# Patient Record
Sex: Female | Born: 1970 | Race: White | Hispanic: No | Smoking: Former smoker
Health system: Southern US, Community
[De-identification: ages and names within clinical notes are randomized; demographics above are authoritative.]

## PROBLEM LIST (undated history)

## (undated) DIAGNOSIS — O3090 Multiple gestation, unspecified, unspecified trimester: Secondary | ICD-10-CM

## (undated) DIAGNOSIS — G4730 Sleep apnea, unspecified: Secondary | ICD-10-CM

## (undated) DIAGNOSIS — G4733 Obstructive sleep apnea (adult) (pediatric): Secondary | ICD-10-CM

## (undated) DIAGNOSIS — K760 Fatty (change of) liver, not elsewhere classified: Secondary | ICD-10-CM

## (undated) DIAGNOSIS — K921 Melena: Secondary | ICD-10-CM

## (undated) DIAGNOSIS — E611 Iron deficiency: Secondary | ICD-10-CM

## (undated) DIAGNOSIS — E785 Hyperlipidemia, unspecified: Secondary | ICD-10-CM

## (undated) DIAGNOSIS — N200 Calculus of kidney: Secondary | ICD-10-CM

## (undated) DIAGNOSIS — T7840XA Allergy, unspecified, initial encounter: Secondary | ICD-10-CM

## (undated) DIAGNOSIS — E559 Vitamin D deficiency, unspecified: Secondary | ICD-10-CM

## (undated) DIAGNOSIS — N12 Tubulo-interstitial nephritis, not specified as acute or chronic: Secondary | ICD-10-CM

## (undated) DIAGNOSIS — F32A Depression, unspecified: Secondary | ICD-10-CM

## (undated) DIAGNOSIS — G479 Sleep disorder, unspecified: Secondary | ICD-10-CM

## (undated) DIAGNOSIS — F329 Major depressive disorder, single episode, unspecified: Secondary | ICD-10-CM

## (undated) DIAGNOSIS — E063 Autoimmune thyroiditis: Secondary | ICD-10-CM

## (undated) DIAGNOSIS — F419 Anxiety disorder, unspecified: Secondary | ICD-10-CM

## (undated) DIAGNOSIS — E039 Hypothyroidism, unspecified: Secondary | ICD-10-CM

## (undated) DIAGNOSIS — D649 Anemia, unspecified: Secondary | ICD-10-CM

## (undated) HISTORY — DX: Fatty (change of) liver, not elsewhere classified: K76.0

## (undated) HISTORY — DX: Calculus of kidney: N20.0

## (undated) HISTORY — DX: Multiple gestation, unspecified, unspecified trimester: O30.90

## (undated) HISTORY — DX: Hyperlipidemia, unspecified: E78.5

## (undated) HISTORY — DX: Obstructive sleep apnea (adult) (pediatric): G47.33

## (undated) HISTORY — DX: Hypothyroidism, unspecified: E03.9

## (undated) HISTORY — DX: Anemia, unspecified: D64.9

## (undated) HISTORY — DX: Anxiety disorder, unspecified: F41.9

## (undated) HISTORY — DX: Vitamin D deficiency, unspecified: E55.9

## (undated) HISTORY — DX: Depression, unspecified: F32.A

## (undated) HISTORY — DX: Tubulo-interstitial nephritis, not specified as acute or chronic: N12

## (undated) HISTORY — DX: Autoimmune thyroiditis: E06.3

## (undated) HISTORY — DX: Iron deficiency: E61.1

## (undated) HISTORY — DX: Major depressive disorder, single episode, unspecified: F32.9

## (undated) HISTORY — DX: Sleep apnea, unspecified: G47.30

## (undated) HISTORY — DX: Sleep disorder, unspecified: G47.9

## (undated) HISTORY — DX: Allergy, unspecified, initial encounter: T78.40XA

## (undated) HISTORY — PX: WISDOM TOOTH EXTRACTION: SHX21

## (undated) HISTORY — DX: Melena: K92.1

---

## 2001-12-08 ENCOUNTER — Encounter: Payer: Self-pay | Admitting: Obstetrics and Gynecology

## 2001-12-08 ENCOUNTER — Encounter: Admission: RE | Admit: 2001-12-08 | Discharge: 2001-12-08 | Payer: Self-pay | Admitting: Obstetrics and Gynecology

## 2002-09-19 ENCOUNTER — Inpatient Hospital Stay (HOSPITAL_COMMUNITY): Admission: AD | Admit: 2002-09-19 | Discharge: 2002-09-21 | Payer: Self-pay | Admitting: Obstetrics and Gynecology

## 2004-09-18 ENCOUNTER — Observation Stay (HOSPITAL_COMMUNITY): Admission: AD | Admit: 2004-09-18 | Discharge: 2004-09-18 | Payer: Self-pay | Admitting: Obstetrics & Gynecology

## 2005-01-26 ENCOUNTER — Inpatient Hospital Stay (HOSPITAL_COMMUNITY): Admission: AD | Admit: 2005-01-26 | Discharge: 2005-01-26 | Payer: Self-pay | Admitting: Obstetrics and Gynecology

## 2005-03-19 ENCOUNTER — Inpatient Hospital Stay (HOSPITAL_COMMUNITY): Admission: AD | Admit: 2005-03-19 | Discharge: 2005-03-21 | Payer: Self-pay | Admitting: Obstetrics and Gynecology

## 2005-08-05 ENCOUNTER — Ambulatory Visit: Payer: Self-pay | Admitting: Internal Medicine

## 2006-08-25 ENCOUNTER — Ambulatory Visit: Payer: Self-pay | Admitting: Internal Medicine

## 2006-09-02 ENCOUNTER — Ambulatory Visit (HOSPITAL_COMMUNITY): Admission: RE | Admit: 2006-09-02 | Discharge: 2006-09-02 | Payer: Self-pay | Admitting: Urology

## 2006-09-29 ENCOUNTER — Ambulatory Visit: Payer: Self-pay | Admitting: Internal Medicine

## 2007-06-23 ENCOUNTER — Ambulatory Visit: Payer: Self-pay | Admitting: Internal Medicine

## 2007-06-23 DIAGNOSIS — Z87448 Personal history of other diseases of urinary system: Secondary | ICD-10-CM | POA: Insufficient documentation

## 2007-06-23 LAB — CONVERTED CEMR LAB
Bilirubin Urine: NEGATIVE
Blood in Urine, dipstick: NEGATIVE
Glucose, Urine, Semiquant: NEGATIVE
Ketones, urine, test strip: NEGATIVE
Nitrite: NEGATIVE
Protein, U semiquant: NEGATIVE
Specific Gravity, Urine: <1.005
Urobilinogen, UA: NEGATIVE
WBC Urine, dipstick: NEGATIVE
pH: 7

## 2007-07-05 ENCOUNTER — Encounter: Payer: Self-pay | Admitting: Internal Medicine

## 2007-09-22 ENCOUNTER — Encounter: Admission: RE | Admit: 2007-09-22 | Discharge: 2007-09-22 | Payer: Self-pay | Admitting: Obstetrics and Gynecology

## 2007-12-23 ENCOUNTER — Encounter: Payer: Self-pay | Admitting: Internal Medicine

## 2008-01-17 ENCOUNTER — Encounter: Payer: Self-pay | Admitting: Pulmonary Disease

## 2008-01-18 ENCOUNTER — Encounter: Payer: Self-pay | Admitting: Pulmonary Disease

## 2008-01-31 ENCOUNTER — Ambulatory Visit: Payer: Self-pay | Admitting: Internal Medicine

## 2008-11-22 ENCOUNTER — Ambulatory Visit: Payer: Self-pay | Admitting: Internal Medicine

## 2008-11-22 DIAGNOSIS — E785 Hyperlipidemia, unspecified: Secondary | ICD-10-CM | POA: Insufficient documentation

## 2008-11-22 LAB — CONVERTED CEMR LAB
Cholesterol, target level: 200 mg/dL
HDL goal, serum: 40 mg/dL
LDL Goal: 160 mg/dL

## 2008-11-23 LAB — CONVERTED CEMR LAB: Glucose, Bld: 82 mg/dL (ref 70–99)

## 2008-11-26 ENCOUNTER — Encounter (INDEPENDENT_AMBULATORY_CARE_PROVIDER_SITE_OTHER): Payer: Self-pay | Admitting: *Deleted

## 2008-11-27 ENCOUNTER — Encounter: Payer: Self-pay | Admitting: Internal Medicine

## 2008-12-12 ENCOUNTER — Telehealth: Payer: Self-pay | Admitting: Internal Medicine

## 2008-12-17 HISTORY — PX: SEPTOPLASTY: SHX2393

## 2009-02-05 ENCOUNTER — Ambulatory Visit: Payer: Self-pay | Admitting: Internal Medicine

## 2009-04-09 ENCOUNTER — Ambulatory Visit: Payer: Self-pay | Admitting: Internal Medicine

## 2009-05-15 ENCOUNTER — Ambulatory Visit: Payer: Self-pay | Admitting: Internal Medicine

## 2009-05-15 DIAGNOSIS — G479 Sleep disorder, unspecified: Secondary | ICD-10-CM | POA: Insufficient documentation

## 2009-05-15 LAB — CONVERTED CEMR LAB
HDL goal, serum: 50 mg/dL
LDL Goal: 130 mg/dL

## 2009-05-29 ENCOUNTER — Telehealth (INDEPENDENT_AMBULATORY_CARE_PROVIDER_SITE_OTHER): Payer: Self-pay | Admitting: *Deleted

## 2009-05-30 ENCOUNTER — Ambulatory Visit: Payer: Self-pay | Admitting: Internal Medicine

## 2009-06-04 ENCOUNTER — Telehealth (INDEPENDENT_AMBULATORY_CARE_PROVIDER_SITE_OTHER): Payer: Self-pay | Admitting: *Deleted

## 2009-06-04 LAB — CONVERTED CEMR LAB
ALT: 35 units/L (ref 0–35)
Albumin: 3.6 g/dL (ref 3.5–5.2)
Alkaline Phosphatase: 93 units/L (ref 39–117)
Basophils Relative: 0.6 % (ref 0.0–3.0)
Bilirubin, Direct: 0.1 mg/dL (ref 0.0–0.3)
CO2: 25 meq/L (ref 19–32)
Chloride: 105 meq/L (ref 96–112)
Creatinine, Ser: 0.9 mg/dL (ref 0.4–1.2)
Eosinophils Relative: 1.3 % (ref 0.0–5.0)
Glucose, Bld: 88 mg/dL (ref 70–99)
HCT: 40.1 % (ref 36.0–46.0)
Hemoglobin: 13.8 g/dL (ref 12.0–15.0)
Lymphs Abs: 2 10*3/uL (ref 0.7–4.0)
MCV: 85.1 fL (ref 78.0–100.0)
Monocytes Relative: 6.1 % (ref 3.0–12.0)
Neutro Abs: 5.5 10*3/uL (ref 1.4–7.7)
Sodium: 138 meq/L (ref 135–145)
TSH: 3.26 microintl units/mL (ref 0.35–5.50)
Total Protein: 7.3 g/dL (ref 6.0–8.3)
WBC: 8.1 10*3/uL (ref 4.5–10.5)

## 2009-06-05 ENCOUNTER — Encounter (INDEPENDENT_AMBULATORY_CARE_PROVIDER_SITE_OTHER): Payer: Self-pay | Admitting: *Deleted

## 2009-06-26 ENCOUNTER — Encounter: Payer: Self-pay | Admitting: Internal Medicine

## 2009-06-26 ENCOUNTER — Telehealth (INDEPENDENT_AMBULATORY_CARE_PROVIDER_SITE_OTHER): Payer: Self-pay | Admitting: *Deleted

## 2009-07-03 ENCOUNTER — Telehealth (INDEPENDENT_AMBULATORY_CARE_PROVIDER_SITE_OTHER): Payer: Self-pay | Admitting: *Deleted

## 2009-07-04 ENCOUNTER — Ambulatory Visit: Payer: Self-pay | Admitting: Internal Medicine

## 2009-07-04 DIAGNOSIS — E039 Hypothyroidism, unspecified: Secondary | ICD-10-CM | POA: Insufficient documentation

## 2009-07-04 DIAGNOSIS — R0609 Other forms of dyspnea: Secondary | ICD-10-CM

## 2009-07-04 DIAGNOSIS — R030 Elevated blood-pressure reading, without diagnosis of hypertension: Secondary | ICD-10-CM | POA: Insufficient documentation

## 2009-07-04 DIAGNOSIS — R0989 Other specified symptoms and signs involving the circulatory and respiratory systems: Secondary | ICD-10-CM | POA: Insufficient documentation

## 2009-07-08 ENCOUNTER — Encounter (INDEPENDENT_AMBULATORY_CARE_PROVIDER_SITE_OTHER): Payer: Self-pay | Admitting: *Deleted

## 2009-09-17 ENCOUNTER — Encounter: Payer: Self-pay | Admitting: Pulmonary Disease

## 2009-09-18 ENCOUNTER — Encounter: Payer: Self-pay | Admitting: Pulmonary Disease

## 2010-01-21 ENCOUNTER — Encounter: Payer: Self-pay | Admitting: Internal Medicine

## 2010-02-07 ENCOUNTER — Encounter: Payer: Self-pay | Admitting: Pulmonary Disease

## 2010-07-02 ENCOUNTER — Ambulatory Visit: Payer: Self-pay | Admitting: Pulmonary Disease

## 2010-07-02 DIAGNOSIS — G4710 Hypersomnia, unspecified: Secondary | ICD-10-CM | POA: Insufficient documentation

## 2010-08-08 ENCOUNTER — Telehealth (INDEPENDENT_AMBULATORY_CARE_PROVIDER_SITE_OTHER): Payer: Self-pay | Admitting: *Deleted

## 2010-08-14 ENCOUNTER — Ambulatory Visit: Payer: Self-pay | Admitting: Pulmonary Disease

## 2010-08-27 ENCOUNTER — Encounter (INDEPENDENT_AMBULATORY_CARE_PROVIDER_SITE_OTHER): Payer: Self-pay | Admitting: *Deleted

## 2010-10-10 ENCOUNTER — Ambulatory Visit: Payer: Self-pay | Admitting: Pulmonary Disease

## 2010-12-14 LAB — CONVERTED CEMR LAB
Basophils Absolute: 0 10*3/uL (ref 0.0–0.1)
Eosinophils Absolute: 0.1 10*3/uL (ref 0.0–0.7)
HCT: 42.7 % (ref 36.0–46.0)
Hemoglobin: 14.4 g/dL (ref 12.0–15.0)
Lymphs Abs: 2.3 10*3/uL (ref 0.7–4.0)
MCHC: 33.6 g/dL (ref 30.0–36.0)
MCV: 87.1 fL (ref 78.0–100.0)
Monocytes Absolute: 0.2 10*3/uL (ref 0.1–1.0)
Monocytes Relative: 1.9 % — ABNORMAL LOW (ref 3.0–12.0)
Neutro Abs: 5.6 10*3/uL (ref 1.4–7.7)
RDW: 14.8 % — ABNORMAL HIGH (ref 11.5–14.6)

## 2010-12-16 NOTE — Assessment & Plan Note (Signed)
Summary: rov for hypersomnia   Primary Provider/Referring Provider:  Pearson Grippe  CC:  Pt states she has been having some sleep issues. Pt states she is not getting a restful sleep. Pt states requip did not help her at all. Pt states she is feeling tired and sleepy throught out the day. Marland Kitchen  History of Present Illness: The pt comes in today for f/u of her known hypersomnia.  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  She was tried on a dopamine agonist last visit, but has not seen any improvement since being on the medication.  She continues to have loud snoring, sleep disruption, and daytime sleepiness that is interfering with her functional status.  Allergies: No Known Drug Allergies  Past History:  Past medical, surgical, family and social histories (including risk factors) reviewed, and no changes noted (except as noted below).  Past Medical History: Reviewed history from 07/02/2010 and no changes required. G2 P2; Pyelonephritis,PMH of;  Hyperlipidemia, LDL goal = < 130 Hypothyroidism 05/2009-controlled  Past Surgical History: Reviewed history from 07/02/2010 and no changes required. Septoplasty 12/2008; Oral Surgery (Wisdom Teeth Extraction);  G2 P2  Family History: Reviewed history from 07/02/2010 and no changes required. PT ADOPTED;   Social History: Reviewed history from 07/02/2010 and no changes required. Married and lives with husband and children. pt has 2 children Alcohol use-no Former Smoker: started at age 56.  1 ppd. quit at age 63.  Regular exercise-yes:3X/week  Review of Systems       The patient complains of nasal congestion/difficulty breathing through nose.  The patient denies shortness of breath with activity, shortness of breath at rest, productive cough, non-productive cough, coughing up blood, chest pain, irregular heartbeats, acid heartburn, indigestion, loss of appetite,  weight change, abdominal pain, difficulty swallowing, sore throat, tooth/dental problems, headaches, sneezing, itching, ear ache, anxiety, depression, hand/feet swelling, joint stiffness or pain, rash, change in color of mucus, and fever.    Vital Signs:  Patient profile:   40 year old female Height:      66 inches Weight:      185.50 pounds O2 Sat:      95 % on Room air Temp:     98 degrees F oral Pulse rate:   98 / minute BP sitting:   114 / 82  (left arm) Cuff size:   regular  Vitals Entered By: Carver Fila (August 14, 2010 9:06 AM)  O2 Flow:  Room air CC: Pt states she has been having some sleep issues. Pt states she is not getting a restful sleep. Pt states requip did not help her at all. Pt states she is feeling tired and sleepy throught out the day.  Comments meds and allergies updated Phone number updated Carver Fila  August 14, 2010 9:08 AM    Physical Exam  General:  ow female in nad Extremities:  no edema or cyanosis  Neurologic:  alert, but does appear sleepy. moves all 4.   Impression & Recommendations:  Problem # 1:  HYPERSOMNIA (ICD-780.54) the pt has very loud snoring during the night, along with an abnormal breathing pattern per her husband.  She has significant daytime sleepiness, and has gained a large amount of weight the last 2 years.  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  I am concerned about her safety while driving, and also her work performance because of this issue.  I would like to try her on cpap to see if she will see improvement.  I have also stressed the need to work on weight loss.  Other Orders: Est. Patient Level III (40347) DME Referral (DME)  Patient Instructions: 1)  will see if we can get you cpap for a trial for treatment of your upper airway resistance 2)  work on weight loss 3)  continue to monitor for  "environmental factors" that can disrupt your sleep. 4)  f/u with me in 4 weeks.   Immunization History:  Influenza Immunization History:    Influenza:  historical (08/25/2009)

## 2010-12-16 NOTE — Letter (Signed)
Summary: CMN for CPAP Supplies/[facility]  CMN for CPAP Supplies/[facility]   Imported By: Sherian Rein 10/15/2010 12:03:41  _____________________________________________________________________  External Attachment:    Type:   Image     Comment:   External Document

## 2010-12-16 NOTE — Letter (Signed)
SummaryScience writer Pulmonary Care Appointment Letter  [facility]  [address]   [place], [place] [address]   Phone: [phone]  Fax: [phone]    08/27/2010 MRN: [id number]  Ashleymarie Voris [address] [place], [place]  [address]  Dear Ms. Stansbury,   Our office is attempting to contact you about an appointment.  Please call our office at [phone] to re-schedule this appointment with Dr._Clance. He is out of office 09/11/10.  Our registration staff is prepared to assist you with any questions you may have.    Thank you,   Nature conservation officer Pulmonary Division

## 2010-12-16 NOTE — Progress Notes (Signed)
Summary: requip not working  Phone Note Call from Patient Call back at Work Phone [phone]   Caller: Patient Call For: clance Summary of Call: pt is calling back on requip this is not working for her needs something else Initial call taken by: Lacinda Axon,  August 08, 2010 12:49 PM  Follow-up for Phone Call        called spoke with patient who states that she still wakes up exhausted every morning even with the requip.  states that at ov, if this didn't work then will consider pulmonary therapies like CPAP.  please advise, thanks!  note: pt cancelled her 9.9.11 ov, has no upcoming scheduled. Follow-up by: Boone Master CNA/MA,  August 08, 2010 3:07 PM  Additional Follow-up for Phone Call Additional follow up Details #1::        I would like to see her back in office to discuss where to go from here. Additional Follow-up by: Barbaraann Share MD,  August 11, 2010 5:08 PM    Additional Follow-up for Phone Call Additional follow up Details #2::    Spoke with pt and scheduled appt with [facility] for 08/14/10 at 9 am. Follow-up by: Vernie Murders,  August 11, 2010 5:16 PM

## 2010-12-16 NOTE — Assessment & Plan Note (Signed)
Summary: self referral for hypersomnia   Copy to:  Self Referral Primary Provider/Referring Provider:  Pearson Grippe  CC:  Sleep Consult.  History of Present Illness: The pt is a 40y/o female who comes in as a self referral for evaluation of daytime sleepiness.  This has been an issue for her since college, and has been persistent in nature.  The pt has had multiple sleep studies for diagnosis, with conflicting data noted.  Her sleep study in 2009 showed an AHI of 0.6/hr, but had large numbers of PLMS with 12/hr resulting in arousal or awakening.  This was followed by an MSLT with a mean sleep latency of 8.36min, and no SOREMs noted.  She had a sleep study in Nov 2010 which showed an AHI of 0.9/hr, but also 110 leg jerks with 5/hr. causing arousal or awakenings.  This was followed by a MSLT with a 1.68min mean sleep onset latency, and 2 SOREMs.  Her sleep diaries and medication lists are not available to me.  She had another sleep study in Mar of this year with an AHI of 3/hr.  The report I have available mentions nothing about leg jerks, but the raw data is not available currently.  The pt was asked to avoid supine position while sleeping, and given nuvigil to use as needed.  She never tried the nuvigil.  The pt has been noted to have loud snoring according to her husband, and has an abnormal breathing pattern during sleep.  She goes to bed btw 10-11pm, and arises at 7-8am to start her day.  She has frequent awakenings at times, and is never rested upon arising.  She sleeps in a different room from her husband, but does note her covers are all "messed up" in the mornings.  She has had RLS symptoms in the early evening, but not on a regular basis.  She notes definite sleep pressure and fogginess during the day, and is not happy with her alertness or memory level.  In the evening, she can read or watch tv without falling asleep.  She can have sleep pressure at times with driving.  She denies any hypnogogic  hallucinations, sleep paralysis, or cateplexy.  She has had no major MVA's or head trauma in the past.  She does state that her weight is up 45 pounds over the last 2 years, and her epworth score today is only 11.  Current Medications (verified): 1)  Lexapro 10 Mg Tabs (Escitalopram Oxalate) .... Take 1 Tablet By Mouth Once A Day 2)  Synthroid 50 Mcg Tabs (Levothyroxine Sodium) .Marland Kitchen.. 1 By Mouth Once Daily 3)  Wellbutrin Xl 300 Mg Xr24h-Tab (Bupropion Hcl) .... Take 1 Tablet By Mouth Once A Day 4)  Birth Control .... Take As Directed  Allergies (verified): No Known Drug Allergies  Past History:  Past Medical History: G2 P2; Pyelonephritis,PMH of;  Hyperlipidemia, LDL goal = < 130 Hypothyroidism 05/2009-controlled  Past Surgical History: Septoplasty 12/2008; Oral Surgery (Wisdom Teeth Extraction);  G2 P2  Family History: Reviewed history from 07/04/2009 and no changes required. PT ADOPTED;   Social History: Reviewed history from 07/04/2009 and no changes required. Married and lives with husband and children. pt has 2 children Alcohol use-no Former Smoker: started at age 67.  1 ppd. quit at age 45.  Regular exercise-yes:3X/week  Review of Systems       The patient complains of weight change and nasal congestion/difficulty breathing through nose.  The patient denies shortness of breath with activity, shortness  of breath at rest, productive cough, non-productive cough, coughing up blood, chest pain, irregular heartbeats, acid heartburn, indigestion, loss of appetite, abdominal pain, difficulty swallowing, sore throat, tooth/dental problems, headaches, sneezing, itching, ear ache, anxiety, depression, hand/feet swelling, joint stiffness or pain, rash, change in color of mucus, and fever.    Vital Signs:  Patient profile:   40 year old female Height:      66 inches Weight:      181.13 pounds BMI:     29.34 O2 Sat:      96 % on Room air Temp:     97.9 degrees F oral Pulse rate:    92 / minute BP sitting:   102 / 64  (left arm) Cuff size:   large  Vitals Entered By: Arman Filter LPN (July 02, 2010 11:06 AM)  O2 Flow:  Room air CC: Sleep Consult Comments Medications reviewed with patient Arman Filter LPN  July 02, 2010 11:10 AM    Physical Exam  General:  ow female in nad Eyes:  PERRLA and EOMI.   Nose:  very narrowed bilat, but not obstructed Mouth:  small oropharynx with crowding, elongation of soft palate and uvula. Neck:  no jvd, tmg, LN Lungs:  clear to auscultation Heart:  rrr, no mrg Abdomen:  soft and nontender, bs+ Extremities:  no edema or cyanosis  pulses intact distally Neurologic:  alert and oriented, moves all 4.   Impression & Recommendations:  Problem # 1:  HYPERSOMNIA (ICD-780.54) the pt has disrupted sleep, feels unrested in the am's, and has daytime sleepiness that is affecting her QOL.  It is hard to make complete sense of her various sleep studies, but it is obvious she does not have clinically significant osa.  She may have UARS in light of her heavy snoring and abnormal upper airway anatomy, which can certainly disrupt her sleep.  She also has large numbers of leg jerks on her sleep studies, with significant sleep disruption documented.  She does not have overly impressive RLS symptoms, but I think she should have a trial of treatment with a dopamine agonist.  Finally, I would find it very unlikely she has narcolepsy.  The history does not fit, her epworth score is only 11, and she is able to read or watch tv at night without getting sleepy.  I would like the patient to try requip each night for at least the next 3 weeks to see if things improve.  I have also encouraged her to work on weight loss and positional therapy.  She will give me feedback in the next 3 weeks.    Medications Added to Medication List This Visit: 1)  Lexapro 10 Mg Tabs (Escitalopram oxalate) .... Take 1 tablet by mouth once a day 2)  Wellbutrin Xl 300 Mg  Xr24h-tab (Bupropion hcl) .... Take 1 tablet by mouth once a day 3)  Birth Control  .... Take as directed 4)  Requip 0.5 Mg Tabs (Ropinirole hcl) .... At bedtime  Other Orders: New Patient Level V (16109)  Patient Instructions: 1)  will try requip 0.5mg  one each night after dinner for a 3 week trial.  Can increase dose to 1mg  if helps, but still having symptoms. 2)  work on weight loss 3)  please call with response in 3 weeks.   Prescriptions: REQUIP 0.5 MG  TABS (ROPINIROLE HCL) At bedtime  #40 x 0   Entered and Authorized by:   Barbaraann Share MD   Signed by:  Barbaraann Share MD on 07/02/2010   Method used:   Print then Give to Patient   RxID:   [id number]

## 2010-12-16 NOTE — Assessment & Plan Note (Addendum)
Summary: rov for UARS   Visit Type:  Follow-up Copy to:  Self Referral Primary Provider/Referring Provider:  Pearson Grippe  CC:  Hypersomnia...pt says she is doing better on cpap w/ full face mask.Marland KitchenMarland Kitchenpressure okay...occ fatigue during the day.  History of Present Illness: the pt comes in today for f/u of her sleep disordered breathing.  She has been on cpap, and has no complaints about the mask or pressure.  She is sleeping much better, with no breakthru snoring.  She is much more rested and alert during the day, and has seen a big difference overall.  Current Medications (verified): 1)  Lexapro 10 Mg Tabs (Escitalopram Oxalate) .... Take 1 Tablet By Mouth Once A Day 2)  Synthroid 50 Mcg Tabs (Levothyroxine Sodium) .Marland Kitchen.. 1 By Mouth Once Daily 3)  Wellbutrin Xl 300 Mg Xr24h-Tab (Bupropion Hcl) .... Take 1 Tablet By Mouth Once A Day 4)  Birth Control .... Take As Directed 5)  Zyrtec Allergy 10 Mg Tbdp (Cetirizine Hcl) .Marland Kitchen.. 1 By Mouth Daily  Allergies (verified): No Known Drug Allergies  Review of Systems       The patient complains of nasal congestion/difficulty breathing through nose.  The patient denies shortness of breath with activity, shortness of breath at rest, productive cough, non-productive cough, coughing up blood, chest pain, irregular heartbeats, acid heartburn, indigestion, loss of appetite, weight change, abdominal pain, difficulty swallowing, sore throat, tooth/dental problems, headaches, sneezing, itching, ear ache, anxiety, depression, hand/feet swelling, joint stiffness or pain, rash, change in color of mucus, and fever.    Vital Signs:  Patient profile:   40 year old female Height:      66 inches (167.64 cm) Weight:      187 pounds (85 kg) BMI:     30.29 O2 Sat:      96 % on Room air Temp:     98.1 degrees F (36.72 degrees C) oral Pulse rate:   100 / minute BP sitting:   118 / 80  (right arm) Cuff size:   regular  Vitals Entered By: Michel Bickers CMA (October 10, 2010  10:14 AM)  O2 Sat at Rest %:  96 O2 Flow:  Room air CC: Hypersomnia...pt says she is doing better on cpap w/ full face mask.Marland KitchenMarland Kitchenpressure okay...occ fatigue during the day Comments Medications reviewed with patient Michel Bickers CMA  October 10, 2010 10:15 AM   Physical Exam  General:  ow female in nad Nose:  no skin breakdown or pressure necrosis from cpap mask Extremities:  no edema or cyanosis  Neurologic:  alert and oriented, moves all four does not appear sleepy   Impression & Recommendations:  Problem # 1:  HYPERSOMNIA (ICD-780.54)  the pt has hypersomnia due to the upper airway resistance syndrome.  She is much improved since being on cpap, and is sleeping better.  She is having no issues with her mask or pressure tolerance.  We need to optimize her pressure for her, and have encouraged her to work on weight loss. Care Plan:  At this point, will arrange for the patient's machine to be changed over to auto mode for 2 weeks to optimize their pressure.  I will review the downloaded data once sent by dme, and also evaluate for compliance, leaks, and residual osa.  I will call the patient and dme to discuss the results, and have the patient's machine set appropriately.  This will serve as the pt's cpap pressure titration.  Medications Added to Medication List This Visit: 1)  Zyrtec Allergy 10 Mg Tbdp (Cetirizine hcl) .Marland Kitchen.. 1 by mouth daily  Other Orders: Est. Patient Level III (16109) DME Referral (DME)  Patient Instructions: 1)  will change machine over to auto mode for 2 weeks to optimize your pressure.  Will let you know the results. 2)  work on weight loss 3)  if doing well, will see you back in 6mos.  Appended Document: rov for UARS megan, find out how pt is doing on cpap.  Does she wish to stay on auto mode, or consider going to set pressure of 12cm?  Appended Document: rov for UARS [id number]  Appended Document: rov for UARS [id number]  Appended Document: rov for  UARS [id number].  This is my 3rd attempt to contact pt.  Per protocol, will sign off on this message and send pt a letter to call our office to discuss how she is doing on cpap.

## 2010-12-16 NOTE — Letter (Signed)
Summary: [facility]  [facility]   Imported By: Lanelle Bal 01/31/2010 12:26:46  _____________________________________________________________________  External Attachment:    Type:   Image     Comment:   External Document

## 2011-01-23 ENCOUNTER — Encounter: Payer: Self-pay | Admitting: Pulmonary Disease

## 2011-01-27 NOTE — Letter (Signed)
Summary: [facility]  [address]   [place], [place] [address]   Phone: [phone]  Fax: [phone]    01/23/2011  Traci Lopez [address] [place], [place]  [address]  Dear Ms. Malachi,   We have called you by phone several times but have been unable to reach you.  Please call our office at your earliest convenience.  Thank you        Sincerely,   Marcelyn Bruins, M.D.

## 2011-04-01 ENCOUNTER — Encounter: Payer: Self-pay | Admitting: Pulmonary Disease

## 2011-04-03 NOTE — Op Note (Signed)
   NAME:  Traci Lopez, Traci Lopez                             ACCOUNT NO.:  [id number]   MEDICAL RECORD NO.:  [id number]                   PATIENT TYPE:  INP   LOCATION:  9169                                 FACILITY:  [facility]   PHYSICIAN:  Lenoard Aden, M.D.             DATE OF BIRTH:  Jan 14, 1971   DATE OF PROCEDURE:  09/19/2002  DATE OF DISCHARGE:                                 OPERATIVE REPORT   OPERATIVE DELIVERY NOTE:   INDICATION FOR PROCEDURE:  Vaginal delivery, fetal tachycardia, and maternal  exhaustion, prolonged second stage.   POSTOPERATIVE DIAGNOSIS:  Vaginal delivery, fetal tachycardia, and maternal  exhaustion, prolonged second stage.   PROCEDURE:  Low vacuum-assisted vaginal delivery.   SURGEON:  Lenoard Aden, M.D.   ESTIMATED BLOOD LOSS:  800 cc.   COMPLICATIONS:  None.   LACERATION:  Right vaginal wall laceration, central median episiotomy with  repair.   DISPOSITION:  Mother and baby doing well.   FINDINGS:  Full-term living female, Apgars 8 and 75.  To newborn nursery.   DESCRIPTION OF PROCEDURE:  After being apprised of the risks and benefits of  vacuum assistance to include cephalohematoma, scalp laceration, and remote  chance of intracranial hemorrhage, the patient and husband consent to  procedure.  Foley catheter is removed.  The patient is prepped in the usual  sterile fashion.  Fetal vertex at LOA less than 45 degrees, +3 station.  Mity-Vac mushroom cup placed for four pulls over central median episiotomy  with right lateral vaginal wall laceration, and a full-term living female  bulb-suctioned on the perineum, Apgars 8 and 9.  Placenta delivered manually  intact.  Three-vessel cord noted.  The cervix and vagina inspected.  Right  lateral wall laceration repaired with 3-0 Monocryl in an interrupted  fashion.  Cervix without lacerations.  Repair of episiotomy with a 3-0  Rapide.  No complications.  Estimated blood loss 800 cc.  Mother and baby  doing  well and recovering in good condition.                                                 Lenoard Aden, M.D.    RJT/MEDQ  D:  09/19/2002  T:  09/20/2002  Job:  [id number]

## 2011-04-03 NOTE — Consult Note (Signed)
NAME:  Traci Lopez, Traci Lopez NO.:  [id number]   MEDICAL RECORD NO.:  [id number]          PATIENT TYPE:  MAT   LOCATION:  [facility]C                          FACILITY:  [facility]   PHYSICIAN:  Lenoard Aden, M.D.DATE OF BIRTH:  04-Nov-1971   DATE OF CONSULTATION:  DATE OF DISCHARGE:                                   CONSULTATION   CHIEF COMPLAINT:  Right flank pain.   HISTORY OF PRESENT ILLNESS:  The patient is a 40 year old white female, G2,  P1 at 60 weeks' gestation who presents with right flank pain and known  history of kidney stones.   ALLERGIES:  She has no known drug allergies.   MEDICATIONS:  Zoloft, prenatal vitamins.   PAST MEDICAL HISTORY:  History of uncomplicated vaginal delivery in 2003.  No other medical or surgical hospitalizations except for the previously  noted history of kidney stones.   LABORATORY DATA:  O positive, rubella immune.  Hepatitis, HIV negative.  Urinalysis consists of moderate hematuria.   Ultrasound consistent with a questionable small stone within the lower pole  of the right kidney, but no evidence of hydronephrosis.   Pregnancy course otherwise uncomplicated.  She has a history of situational  dysphoria for which she was placed on Zoloft with this pregnancy.   PHYSICAL EXAMINATION:  GENERAL:  She is a somewhat uncomfortable-appearing  white female in no acute distress.  VITAL SIGNS:  Stable.  The patient is afebrile.  LUNGS:  Clear.  HEART:  Regular rhythm.  ABDOMEN:  Soft, gravid, nontender.  There is right flank tenderness noted.  No CVA tenderness present.  PELVIC:  Exam deferred.  NST is reactive.  No contractions noted.   IMPRESSION:  Probable right urolithiasis, no pyelonephritis.  Reassuring  fetal heart rate status at 32 weeks.   PLAN:  Discharge home.  Percocet prescription given.  Fluid hydration  discussed.  Follow up in the office in 24-48 hours.      RJT/MEDQ  D:  01/26/2005  T:  01/26/2005  Job:  [id number]

## 2011-04-03 NOTE — H&P (Signed)
NAMESHERION, DOOLY                   ACCOUNT NO.:  [id number]   MEDICAL RECORD NO.:  [id number]          PATIENT TYPE:  INP   LOCATION:  9165                          [facility]ACILITY:  [facility]   PHYSICIAN:  Lenoard Aden, M.D.DATE OF BIRTH:  Nov 06, 1971   DATE OF ADMISSION:  03/19/2005  DATE OF DISCHARGE:                                HISTORY & PHYSICAL   CHIEF COMPLAINT:  SGA.   HISTORY OF PRESENT ILLNESS:  A 40 year old white female G2 P1 with EDD Mar 19, 2005 at [redacted] weeks gestation, size/dates discrepancy, and presumed SGA, for  induction.   PAST MEDICAL HISTORY:  Remarkable for urolithiasis, history of depression on  Zoloft, history of preterm birth at 44 weeks in 2003.   PRENATAL LABORATORY DATA:  Revealed a blood type of O positive, Rh antibody  negative. Rubella immune. GBS negative. Hepatitis nonreactive.   MEDICATIONS:  Prenatal vitamins and Zoloft. She has no known drug allergies.   PHYSICAL EXAMINATION:  GENERAL:  She is a well-developed, well-nourished  white female in no acute distress.  HEENT:  Normal.  LUNGS:  Clear.  HEART:  Regular rate and rhythm.  ABDOMEN:  Soft, gravid, nontender. Estimated fetal weight of about 6-and-a-  half to 7 pounds.  PELVIC:  Cervix is 2-3, 70%, and -1.  EXTREMITIES:  Reveal no cords.  NEUROLOGIC:  Nonfocal.   IMPRESSION:  Presumed small-for-gestational age at term.   PLAN:  Proceed with induction. Anticipate attempts at vaginal delivery.      RJT/MEDQ  D:  03/19/2005  T:  03/19/2005  Job:  [id number]

## 2011-04-08 ENCOUNTER — Ambulatory Visit (INDEPENDENT_AMBULATORY_CARE_PROVIDER_SITE_OTHER): Payer: Managed Care, Other (non HMO) | Admitting: Pulmonary Disease

## 2011-04-08 ENCOUNTER — Encounter: Payer: Self-pay | Admitting: Pulmonary Disease

## 2011-04-08 ENCOUNTER — Ambulatory Visit: Payer: Self-pay | Admitting: Pulmonary Disease

## 2011-04-08 DIAGNOSIS — G4730 Sleep apnea, unspecified: Secondary | ICD-10-CM | POA: Insufficient documentation

## 2011-04-08 DIAGNOSIS — G4710 Hypersomnia, unspecified: Secondary | ICD-10-CM

## 2011-04-08 DIAGNOSIS — G478 Other sleep disorders: Secondary | ICD-10-CM

## 2011-04-08 NOTE — Assessment & Plan Note (Signed)
The pt has done very well with cpap, and is wearing compliantly.  She has seen improved sleep and daytime alertness.  She is currently on auto, but has an optimized fixed pressure of 12cm.  She would like to try this to see if she prefers over auto mode.  I have encouraged her to work harder on weight reduction.

## 2011-04-08 NOTE — Patient Instructions (Signed)
Continue with cpap, and we will get your machine changed over to a fixed pressure of 12cm.  Call if you prefer auto mode.  Work on weight loss followup in one year.

## 2011-04-08 NOTE — Progress Notes (Signed)
  Subjective:    Patient ID: Traci Lopez, female    DOB: 06-28-1971, 40 y.o.   MRN: [id number]  HPI The pt comes in today for f/u of her known osa.  She is wearing cpap compliantly, and reports no issues with mask fit or pressure.  Her pressure has been optimized to 12cm of water.  She feels she is sleeping well with the device, and is satisfied with her daytime alertness.   Review of Systems  Constitutional: Negative for fever and unexpected weight change.  HENT: Positive for congestion and sneezing. Negative for ear pain, nosebleeds, sore throat, rhinorrhea, trouble swallowing, dental problem, postnasal drip and sinus pressure.   Eyes: Negative for redness and itching.  Respiratory: Negative for cough, chest tightness, shortness of breath and wheezing.   Cardiovascular: Negative for palpitations and leg swelling.  Gastrointestinal: Negative for nausea and vomiting.  Genitourinary: Negative for dysuria.  Musculoskeletal: Negative for joint swelling.  Skin: Negative for rash.  Neurological: Negative for headaches.  Hematological: Does not bruise/bleed easily.  Psychiatric/Behavioral: Negative for dysphoric mood. The patient is not nervous/anxious.        Objective:   Physical Exam Obese female in nad  No skin breakdown or pressure necrosis from cpap mask LE without edema, no cyanosis  Alert, does not appear sleepy, moves all 4        Assessment & Plan:

## 2012-02-01 ENCOUNTER — Other Ambulatory Visit: Payer: Self-pay | Admitting: Internal Medicine

## 2012-02-01 DIAGNOSIS — R945 Abnormal results of liver function studies: Secondary | ICD-10-CM

## 2012-02-04 ENCOUNTER — Ambulatory Visit
Admission: RE | Admit: 2012-02-04 | Discharge: 2012-02-04 | Disposition: A | Discharge status: Home / Self Care | Payer: 59 | Source: Ambulatory Visit | Attending: Internal Medicine | Admitting: Internal Medicine

## 2012-02-04 DIAGNOSIS — R945 Abnormal results of liver function studies: Secondary | ICD-10-CM

## 2012-04-07 ENCOUNTER — Ambulatory Visit: Payer: Managed Care, Other (non HMO) | Admitting: Pulmonary Disease

## 2012-04-21 ENCOUNTER — Ambulatory Visit (INDEPENDENT_AMBULATORY_CARE_PROVIDER_SITE_OTHER): Payer: 59 | Admitting: Pulmonary Disease

## 2012-04-21 ENCOUNTER — Encounter: Payer: Self-pay | Admitting: Pulmonary Disease

## 2012-04-21 VITALS — BP 118/82 | HR 93 | Temp 98.1°F | Ht 66.0 in | Wt 210.0 lb

## 2012-04-21 DIAGNOSIS — G4733 Obstructive sleep apnea (adult) (pediatric): Secondary | ICD-10-CM

## 2012-04-21 NOTE — Patient Instructions (Signed)
Continue on cpap, and keep up with supplies Work on weight loss followup with me in one year.  

## 2012-04-21 NOTE — Progress Notes (Signed)
  Subjective:    Patient ID: Traci Lopez, female    DOB: Apr 01, 1971, 41 y.o.   MRN: [id number]  HPI She comes in today for followup of her known obstructive sleep apnea.  She is wearing CPAP compliantly, and is having no issues with mask fit or pressure.  She remains on the automatic setting.  She feels that she is sleeping well, and her daytime alertness is near her usual baseline.  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  She has gained significant weight since last visit.   Review of Systems  Constitutional: Negative.  Negative for fever and unexpected weight change.  HENT: Positive for sneezing and sinus pressure. Negative for ear pain, nosebleeds, congestion, sore throat, rhinorrhea, trouble swallowing, dental problem and postnasal drip.   Eyes: Negative.  Negative for redness and itching.  Respiratory: Negative.  Negative for cough, chest tightness, shortness of breath and wheezing.   Cardiovascular: Negative.  Negative for palpitations and leg swelling.  Gastrointestinal: Negative.  Negative for nausea and vomiting.  Genitourinary: Negative.  Negative for dysuria.  Musculoskeletal: Negative.  Negative for joint swelling.  Skin: Negative.  Negative for rash.  Neurological: Negative.  Negative for headaches.  Hematological: Negative.  Does not bruise/bleed easily.  Psychiatric/Behavioral: Negative.  Negative for dysphoric mood. The patient is not nervous/anxious.        Objective:   Physical Exam Obese female in no acute distress No skin breakdown or pressure necrosis from the CPAP mask Lower extremities without edema, no cyanosis Alert and oriented, moves all 4 extremities.       Assessment & Plan:

## 2012-04-21 NOTE — Assessment & Plan Note (Signed)
The patient is doing well from a sleep apnea standpoint, is wearing CPAP consistently with good results.  She is having no issues with her mask fit or pressure, and overall feels that her alertness and concentration during the day is fairly normal.  She has gained significant weight since the last visit, but since she is on the automatic mode this should make the pressure adjustments for her.  I've asked her to keep up with mask changes and supplies, and to followup with me in one year if doing well.

## 2012-12-06 ENCOUNTER — Other Ambulatory Visit: Payer: Self-pay | Admitting: Physical Medicine and Rehabilitation

## 2012-12-06 DIAGNOSIS — K760 Fatty (change of) liver, not elsewhere classified: Secondary | ICD-10-CM

## 2012-12-12 ENCOUNTER — Other Ambulatory Visit: Payer: 59

## 2012-12-14 ENCOUNTER — Encounter: Payer: Self-pay | Admitting: Cardiology

## 2012-12-14 ENCOUNTER — Ambulatory Visit (INDEPENDENT_AMBULATORY_CARE_PROVIDER_SITE_OTHER): Payer: 59 | Admitting: Cardiology

## 2012-12-14 VITALS — BP 130/80 | HR 93 | Ht 66.0 in | Wt 205.0 lb

## 2012-12-14 DIAGNOSIS — E785 Hyperlipidemia, unspecified: Secondary | ICD-10-CM

## 2012-12-14 DIAGNOSIS — R0989 Other specified symptoms and signs involving the circulatory and respiratory systems: Secondary | ICD-10-CM

## 2012-12-14 NOTE — Progress Notes (Signed)
Traci Lopez Date of Birth: [birth year] Medical Record #[id number]  History of Present Illness: Traci Lopez is seen today at the request of Dr. Tenny Craw for evaluation of cardiac risk. She is very pleasant 42 year old white female who has a history of severe combined hyperlipidemia. She also has a history of hypothyroidism. Her family history is unknown since she is adopted. A little over a year ago she experienced some chest tightness. She states she felt like she needed to stretch her shoulders back to loosen her chest. This discomfort was constant for several days and lasted 2-3 weeks. It then resolved and she has had no further chest tightness since then. The only other time she gets chest pain his she drinks alcohol she gets a severe chest tightness and breaks out in a sweat. She does have a history of morbid obesity. She has gained 80 pounds over the past 5 years. She was diagnosed with nonalcoholic fatty liver disease. She denies any history of hypertension or diabetes. She is a nonsmoker. She was recently started on some core one month ago and appears to be tolerating this well.  Current Outpatient Prescriptions on File Prior to Visit  Medication Sig Dispense Refill  . niacin-simvastatin (SIMCOR) 1000-20 MG 24 hr tablet Take 1 tablet by mouth at bedtime.        No Known Allergies  Past Medical History  Diagnosis Date  . Pyelonephritis   . Hyperlipidemia   . Hypothyroidism   . Multiple pregnancy     g2 p2  . Fatty liver disease, nonalcoholic   . OSA (obstructive sleep apnea)     on CPAP    Past Surgical History  Procedure Date  . Septoplasty 12/2008  . Wisdom tooth extraction     History  Smoking status  . Former Smoker -- 1.0 packs/day for 10 years  . Types: Cigarettes  . Quit date: 11/16/2000  Smokeless tobacco  . Not on file    History  Alcohol Use No    Family History  Problem Relation Age of Onset  . Adopted: Yes  . Family history unknown: Yes    Review of  Systems: The review of systems is positive for sedentary lifestyle. She is interested in starting Effexor last program. All other systems were reviewed and are negative.  Physical Exam: BP 130/80  Pulse 93  Ht 5\' 6"  (1.676 m)  Wt 205 lb (92.987 kg)  BMI 33.09 kg/m2 She is a pleasant, obese white female in no acute distress.The patient is alert and oriented x 3.  The mood and affect are normal.  The skin is warm and dry. There are no xanthelasmas or tendon xanthomas. Color is normal.  The HEENT exam reveals that the sclera are nonicteric.  The mucous membranes are moist.  The carotids are 2+ without bruits.  There is no thyromegaly.  There is no JVD.  The lungs are clear.  The chest wall is non tender.  The heart exam reveals a regular rate with a normal S1 and S2.  There are no murmurs, gallops, or rubs.  The PMI is not displaced.   Abdominal exam reveals good bowel sounds.  There is no guarding or rebound.  There is no hepatosplenomegaly or tenderness.  There are no masses.  Exam of the legs reveal no clubbing, cyanosis, or edema.   The distal pulses are intact.  Cranial nerves II - XII are intact.  Motor and sensory functions are intact.  The gait is normal.  LABORATORY  DATA: ECG demonstrates normal sinus rhythm with low voltage consistent with her body habitus. It is otherwise normal.  Her most recent lipid panel on 11/02/2012 shows a total cholesterol of 260, crit distress 234, HDL 45, LDL 174. TSH and free T4 were normal. CRP level was elevated at 5.7.  Patient did have a lipoma had analysis and 2010. This showed an LDL particle concentration of 2214. LDL size was 20.5 nm. LDL at that time was 167 with an HDL of 51.  Assessment / Plan: 1. History of atypical chest pain. Patient has not had chest pain over a year. Her former symptoms are more consistent with musculoskeletal pain. Her only cardiac risk factor is her hyperlipidemia. I think her pretest likelihood of disease is low. I'm not  recommended further cardiac testing at this point unless she develops symptoms of chest pain, dyspnea, or decreased exercise tolerance. At that point stress testing would be reasonable.  2. Severe hyperlipidemia. This is probably familial. She clearly needs medical therapy and I think her current choice of Simcor is a reasonable option. I stressed the importance of weight loss and a heart healthy diet. We will need to see the response to her current medication. If she needs further cholesterol lowering to reach her target I would recommend pushing her statin dose. We do not have a lot of compelling data to show benefit of niacin in addition to her statin therapy. I think she will get the most mileage out of statin therapy. If she has not reached her goal on her current medication I would consider switching her to Crestor or Lipitor which are more potent. She will followup with Dr. Tenny Craw for further titration. I'll be happy to see her back as needed.

## 2012-12-14 NOTE — Patient Instructions (Signed)
Continue your efforts at weight loss, exercise, and diet.  Continue the Simcor and monitor your results.

## 2013-04-20 ENCOUNTER — Encounter: Payer: Self-pay | Admitting: Pulmonary Disease

## 2013-04-20 ENCOUNTER — Ambulatory Visit (INDEPENDENT_AMBULATORY_CARE_PROVIDER_SITE_OTHER): Payer: BC Managed Care – PPO | Admitting: Pulmonary Disease

## 2013-04-20 VITALS — BP 110/72 | HR 95 | Temp 97.1°F | Ht 65.75 in | Wt 194.0 lb

## 2013-04-20 DIAGNOSIS — G478 Other sleep disorders: Secondary | ICD-10-CM

## 2013-04-20 DIAGNOSIS — G4730 Sleep apnea, unspecified: Secondary | ICD-10-CM

## 2013-04-20 NOTE — Patient Instructions (Addendum)
Will arrange for mask fitting at the sleep center to look at some of your other options.  If we cannot find a well fitting mask because of your narrow nose, would try a Gecko silicon strip.  Let me know Continue working on weight loss.  You are doing great.  followup with me in one year if doing well.

## 2013-04-20 NOTE — Assessment & Plan Note (Signed)
The patient continues to do well on CPAP with respect to her sleep and daytime alertness, but is having issues with mask leak because of the narrow bridge of her nose.  There have been a lot of new fullface masks introduced recently, and we'll therefore refer her to the sleep center for a mask fitting.  Otherwise, I have asked her to continue on CPAP, and to work aggressively on weight loss.

## 2013-04-20 NOTE — Progress Notes (Signed)
  Subjective:    Patient ID: Traci Lopez, female    DOB: 1971/02/09, 42 y.o.   MRN: [id number]  HPI The patient comes in today for followup of her obstructive sleep apnea.  She is wearing CPAP compliantly, and has seen a big difference in her sleep and daytime alertness.  She is having no issues with pressure, but is having issues with mask leaking.  She is going to need something different.  She has lost 16 pounds since her last visit, and I have commended her on this.   Review of Systems  Constitutional: Negative for fever and unexpected weight change.  HENT: Negative for ear pain, nosebleeds, congestion, sore throat, rhinorrhea, sneezing, trouble swallowing, dental problem, postnasal drip and sinus pressure.        Allergies  Eyes: Negative for redness and itching.  Respiratory: Negative for cough, chest tightness, shortness of breath and wheezing.   Cardiovascular: Negative for palpitations and leg swelling.  Gastrointestinal: Negative for nausea and vomiting.  Genitourinary: Negative for dysuria.  Musculoskeletal: Negative for joint swelling.  Skin: Negative for rash.  Neurological: Negative for headaches.  Hematological: Does not bruise/bleed easily.  Psychiatric/Behavioral: Negative for dysphoric mood. The patient is not nervous/anxious.        Objective:   Physical Exam Overweight female in no acute distress Nose without purulence or discharge noted No skin breakdown or pressure necrosis from the CPAP mask Neck without thyromegaly Lower extremities without edema, cyanosis Alert and oriented, does not appear to be sleepy, moves all 4 extremities.       Assessment & Plan:

## 2013-04-25 ENCOUNTER — Ambulatory Visit (HOSPITAL_BASED_OUTPATIENT_CLINIC_OR_DEPARTMENT_OTHER): Payer: BC Managed Care – PPO | Attending: Pulmonary Disease | Admitting: Radiology

## 2013-04-25 DIAGNOSIS — G4730 Sleep apnea, unspecified: Secondary | ICD-10-CM

## 2013-04-25 DIAGNOSIS — G4733 Obstructive sleep apnea (adult) (pediatric): Secondary | ICD-10-CM

## 2013-05-05 ENCOUNTER — Telehealth: Payer: Self-pay | Admitting: Pulmonary Disease

## 2013-05-05 NOTE — Telephone Encounter (Signed)
Spoke with [facility], they have not received any paperwork on patient for new mask. Spoke with [facility] and [facility] out of office until Monday morning at 7  Pt aware that [facility] not in office until Monday. Pt okay with waiting until Monday for new mask.

## 2013-05-08 NOTE — Telephone Encounter (Signed)
Spoke with vernon pt is going to pick up mask today .tried to call pt lmom that i had spoke with vernon and nothing further needed.  Will close note.

## 2013-05-08 NOTE — Telephone Encounter (Signed)
Pt is calling back.  Pt states she spoke w/ Marita [facility] this AM.  Pt states that [facility] put an order in the "new system" & just needs authorization from [place] to provide pt w/ the mask.  Pt can be reached at [phone].

## 2013-07-20 ENCOUNTER — Telehealth: Payer: Self-pay | Admitting: Pulmonary Disease

## 2013-07-20 DIAGNOSIS — G4730 Sleep apnea, unspecified: Secondary | ICD-10-CM

## 2013-07-20 NOTE — Telephone Encounter (Signed)
I spoke with pt and is aware order has been sent. Nothing further needed

## 2013-07-25 ENCOUNTER — Ambulatory Visit (HOSPITAL_BASED_OUTPATIENT_CLINIC_OR_DEPARTMENT_OTHER): Payer: BC Managed Care – PPO | Attending: Pulmonary Disease | Admitting: Radiology

## 2013-07-25 DIAGNOSIS — G4733 Obstructive sleep apnea (adult) (pediatric): Secondary | ICD-10-CM

## 2013-11-13 ENCOUNTER — Other Ambulatory Visit: Payer: Self-pay

## 2013-11-13 DIAGNOSIS — Z1231 Encounter for screening mammogram for malignant neoplasm of breast: Secondary | ICD-10-CM

## 2013-12-07 ENCOUNTER — Ambulatory Visit
Admission: RE | Admit: 2013-12-07 | Discharge: 2013-12-07 | Disposition: A | Discharge status: Home / Self Care | Payer: Private Health Insurance - Indemnity | Source: Ambulatory Visit

## 2013-12-07 DIAGNOSIS — Z1231 Encounter for screening mammogram for malignant neoplasm of breast: Secondary | ICD-10-CM

## 2014-04-24 ENCOUNTER — Encounter: Payer: Self-pay | Admitting: Pulmonary Disease

## 2014-04-24 ENCOUNTER — Ambulatory Visit (INDEPENDENT_AMBULATORY_CARE_PROVIDER_SITE_OTHER): Payer: Private Health Insurance - Indemnity | Admitting: Pulmonary Disease

## 2014-04-24 VITALS — BP 110/80 | HR 87 | Temp 98.0°F | Ht 66.0 in | Wt 174.6 lb

## 2014-04-24 DIAGNOSIS — G478 Other sleep disorders: Secondary | ICD-10-CM

## 2014-04-24 DIAGNOSIS — G4730 Sleep apnea, unspecified: Secondary | ICD-10-CM

## 2014-04-24 NOTE — Assessment & Plan Note (Signed)
The patient is doing very well with CPAP, and has lost 20 pounds since her last visit. She feels that she is sleeping well with the device, and is satisfied with her daytime alertness. She does want to try a different type of mask, and I will send the order to her home care company accordingly.

## 2014-04-24 NOTE — Patient Instructions (Signed)
Continue with cpap, and keep up with mask changes and supplies. Keep working on weight loss.  You are doing great. followup with me in one year.

## 2014-04-24 NOTE — Progress Notes (Signed)
   Subjective:    Patient ID: Traci Lopez, female    DOB: July 11, 1971, 43 y.o.   MRN: [id number]  HPI Patient comes in today for followup of her obstructive sleep apnea. She is wearing CPAP compliantly, and has no issues with her pressure or symptoms. Her only complaint is the mask not fitting properly on the bridge of her nose. She is interested in trying a nasal mask with chin strap. She has lost 20 pounds since her last visit and is feeling much better.   Review of Systems  Constitutional: Negative for fever and unexpected weight change.  HENT: Negative for congestion, dental problem, ear pain, nosebleeds, postnasal drip, rhinorrhea, sinus pressure, sneezing, sore throat and trouble swallowing.   Eyes: Negative for redness and itching.  Respiratory: Negative for cough, chest tightness, shortness of breath and wheezing.   Cardiovascular: Negative for palpitations and leg swelling.  Gastrointestinal: Negative for nausea and vomiting.  Genitourinary: Negative for dysuria.  Musculoskeletal: Negative for joint swelling.  Skin: Negative for rash.  Neurological: Negative for headaches.  Hematological: Does not bruise/bleed easily.  Psychiatric/Behavioral: Negative for dysphoric mood. The patient is not nervous/anxious.        Objective:   Physical Exam Overweight female in no acute distress Nose without purulence or discharge noted No skin breakdown or pressure necrosis from the CPAP mask Neck without lymphadenopathy or thyromegaly Lower extremities without edema, no cyanosis Alert and oriented, moves all 4 extremities.       Assessment & Plan:

## 2015-04-02 ENCOUNTER — Ambulatory Visit: Payer: Private Health Insurance - Indemnity | Admitting: Pulmonary Disease

## 2016-04-29 ENCOUNTER — Encounter: Payer: Self-pay | Admitting: Neurology

## 2016-04-29 ENCOUNTER — Encounter: Payer: Self-pay | Admitting: *Deleted

## 2016-04-29 ENCOUNTER — Ambulatory Visit (INDEPENDENT_AMBULATORY_CARE_PROVIDER_SITE_OTHER): Payer: Managed Care, Other (non HMO) | Admitting: Neurology

## 2016-04-29 VITALS — BP 118/79 | HR 81 | Ht 66.5 in | Wt 170.8 lb

## 2016-04-29 DIAGNOSIS — R253 Fasciculation: Secondary | ICD-10-CM

## 2016-04-29 DIAGNOSIS — G519 Disorder of facial nerve, unspecified: Secondary | ICD-10-CM

## 2016-04-29 DIAGNOSIS — R51 Headache: Secondary | ICD-10-CM | POA: Diagnosis not present

## 2016-04-29 DIAGNOSIS — R208 Other disturbances of skin sensation: Secondary | ICD-10-CM

## 2016-04-29 DIAGNOSIS — R200 Anesthesia of skin: Secondary | ICD-10-CM

## 2016-04-29 DIAGNOSIS — R202 Paresthesia of skin: Secondary | ICD-10-CM | POA: Diagnosis not present

## 2016-04-29 DIAGNOSIS — M6289 Other specified disorders of muscle: Secondary | ICD-10-CM | POA: Diagnosis not present

## 2016-04-29 DIAGNOSIS — F82 Specific developmental disorder of motor function: Secondary | ICD-10-CM | POA: Diagnosis not present

## 2016-04-29 DIAGNOSIS — R519 Headache, unspecified: Secondary | ICD-10-CM

## 2016-04-29 DIAGNOSIS — R29898 Other symptoms and signs involving the musculoskeletal system: Secondary | ICD-10-CM

## 2016-04-29 DIAGNOSIS — H539 Unspecified visual disturbance: Secondary | ICD-10-CM | POA: Diagnosis not present

## 2016-04-29 NOTE — Progress Notes (Signed)
[facility]    Provider:  Dr Jaynee Eagles Referring Provider: Suella Broad M.D. Primary Care Physician:   Melinda Crutch, MD  CC:  Eye twitching  HPI:  Traci Lopez is a lovely 45 y.o. female here as a referral from Dr. Harrington Challenger for eye twitching. She has a past medical history of Hashimoto's disease, hyperlipidemia, fatty liver nonalcoholic, obstructive sleep apnea on CPAP, lumbar disc degeneration, thoracic spine pain, cervical pain, chronic midline low back pain, occipital neuralgia of the right side. She feels muscle twitching in the left eye, she has a growth in her left temple area dxed as fatty tissue, twitching within the last year, on and off, worse with stress or being tired, not on the right side. The twitching can last a day or two a few seconds then may repeat, no pain, no double vision but does have blurry vision changes, she is having headaches new onset never had headaches before, pressure at the temples and on the left side, no light or sound sensitivity, no nausea or vomiting, not positional, does endorse vision changes, headaches daily. Headaches daily, pressure, +vision changes in the left eye.No hearing changes, she has fine motor difficulty on the left which is new. No facial weakness. Tremors of the left hand, motor changes possible weakness as well. Gradually worsening. Left sided paresthesias in the face. No inciting event for these symptoms, no head trauma or otherwise. No triggers. No other focal neurologic deficits or symptoms or complaints. No family history of neuromuscular disorder.  Reviewed notes, labs and imaging from outside physicians, which showed: Reviewed notes from [facility] orthopedics. Patient is a 45 year old who is following their further back. She was evaluated for left shoulder pain and mid back symptoms rating up to the left shoulder and not getting any better. Previous year she was having neck and head pain which has improved. She has persistent  issues with the left side of the thoracic spine. She has had physical therapy. Physical therapy did not help her. She continues to exercise at home. She sees a Restaurant manager, fast food as well as a massage therapist. She also has some neck and lower back pain. No bowel or bladder dysfunction. No fevers chills or weight loss. MRI of the thoracic spine was recommended.  Review of Systems: Patient complains of symptoms per HPI as well as the following symptoms: Anemia, memory loss, tremor. Pertinent negatives per HPI. All others negative.   Social History   Social History  . Marital Status: Married    Spouse Name: [name]  . Number of Children: 2  . Years of Education: BA   Occupational History  . stay at home mom    Social History Main Topics  . Smoking status: Former Smoker -- 1.00 packs/day for 10 years    Types: Cigarettes    Quit date: 11/16/2000  . Smokeless tobacco: Not on file  . Alcohol Use: No  . Drug Use: Not on file  . Sexual Activity: Not on file   Other Topics Concern  . Not on file   Social History Narrative   Lives spouse   Caffeine use:  none   Regular exercise - 3x per week    Family History  Problem Relation Age of Onset  . Adopted: Yes  . Neuropathy Neg Hx   . Seizures Neg Hx   . Migraines Neg Hx     Past Medical History  Diagnosis Date  . Pyelonephritis   . Hyperlipidemia   . Hypothyroidism   .  Multiple pregnancy     g2 p2  . Fatty liver disease, nonalcoholic   . OSA (obstructive sleep apnea)     on CPAP  . Hashimoto's disease     Past Surgical History  Procedure Laterality Date  . Septoplasty  12/2008  . Wisdom tooth extraction      Current Outpatient Prescriptions  Medication Sig Dispense Refill  . buPROPion (WELLBUTRIN XL) 300 MG 24 hr tablet Take 300 mg by mouth daily.    . cetirizine (ZYRTEC) 10 MG tablet Take 10 mg by mouth daily as needed.     . progesterone (PROMETRIUM) 100 MG capsule Take 100 mg by mouth daily.    Marland Kitchen SYNTHROID 137 MCG  tablet Take 137 mcg by mouth daily.     No current facility-administered medications for this visit.    Allergies as of 04/29/2016  . (No Known Allergies)    Vitals: BP 118/79 mmHg  Pulse 81  Ht 5' 6.5" (1.689 m)  Wt 170 lb 12.8 oz (77.474 kg)  BMI 27.16 kg/m2 Last Weight:  Wt Readings from Last 1 Encounters:  04/29/16 170 lb 12.8 oz (77.474 kg)   Last Height:   Ht Readings from Last 1 Encounters:  04/29/16 5' 6.5" (1.689 m)    Physical exam: Exam: Gen: NAD, conversant, well nourised, Overweight, well groomed                     CV: RRR, no MRG. No Carotid Bruits. No peripheral edema, warm, nontender Eyes: Conjunctivae clear without exudates or hemorrhage  Neuro: Detailed Neurologic Exam  Speech:    Speech is normal; fluent and spontaneous with normal comprehension.  Cognition:    The patient is oriented to person, place, and time;     recent and remote memory intact;     language fluent;     normal attention, concentration,     fund of knowledge Cranial Nerves:    The pupils are equal, round, and reactive to light. The fundi are normal and spontaneous venous pulsations are present. Visual fields are full to finger confrontation. Extraocular movements are intact. Trigeminal sensation is intact and the muscles of mastication are normal. The face is symmetric. The palate elevates in the midline. Hearing intact. Voice is normal. Shoulder shrug is normal. The tongue has normal motion without fasciculations.   Coordination:    Normal finger to nose and heel to shin. Normal rapid alternating movements.   Gait:    Heel-toe and tandem gait are normal.   Motor Observation:    No asymmetry, no atrophy, and no involuntary movements noted. Tone:    Normal muscle tone.    Posture:    Posture is normal. normal erect    Strength:    Strength is V/V in the upper and lower limbs.      Sensation: intact to LT     Reflex Exam:  DTR's:    Deep tendon reflexes in the  upper and lower extremities are normal bilaterally.   Toes:    The toes are downgoing bilaterally.   Clonus:    Clonus is absent.      Assessment/Plan:  This is a very nice 45 year old female with progressive left eye twitching, left facial numbness and paresthesias, an daily headache. We'll order an MRI of the brain to evaluate for intracranial etiologies. If none are found, can recommend Botox in the future for her eye twitching if symptoms persist or worsen. We'll order complete serum evaluation including  ANA, sarcoid, Lyme, B12, CBC, CMP, HIV, magnesium, rheumatoid factor, RPR, sedimentation rate and Sjogren's. She follow-up with me in clinic after MRI of the brain.  To prevent or relieve headaches, try the following: Cool Compress. Lie down and place a cool compress on your head.  Avoid headache triggers. If certain foods or odors seem to have triggered your migraines in the past, avoid them. A headache diary might help you identify triggers.  Include physical activity in your daily routine. Try a daily walk or other moderate aerobic exercise.  Manage stress. Find healthy ways to cope with the stressors, such as delegating tasks on your to-do list.  Practice relaxation techniques. Try deep breathing, yoga, massage and visualization.  Eat regularly. Eating regularly scheduled meals and maintaining a healthy diet might help prevent headaches. Also, drink plenty of fluids.  Follow a regular sleep schedule. Sleep deprivation might contribute to headaches Consider biofeedback. With this mind-body technique, you learn to control certain bodily functions - such as muscle tension, heart rate and blood pressure - to prevent headaches or reduce headache pain.    Proceed to emergency room if you experience new or worsening symptoms or symptoms do not resolve, if you have new neurologic symptoms or if headache is severe, or for any concerning symptom.   Cc: Dr. Nelva Bush and Dr. Lovena Le,  [facility] [address] [place], [place] [address]  Phone [phone] Fax [phone]

## 2016-04-29 NOTE — Patient Instructions (Signed)
Remember to drink plenty of fluid, eat healthy meals and do not skip any meals. Try to eat protein with a every meal and eat a healthy snack such as fruit or nuts in between meals. Try to keep a regular sleep-wake schedule and try to exercise daily, particularly in the form of walking, 20-30 minutes a day, if you can.   As far as your medications are concerned, I would like to suggest: Can consider botox in the future  As far as diagnostic testing: MRI of the brain and labs  I would like to see you back if needed, sooner if we need to. Please call us with any interim questions, concerns, problems, updates or refill requests.   Our phone number is [phone]. We also have an after hours call service for urgent matters and there is a physician on-call for urgent questions. For any emergencies you know to call 911 or go to the nearest emergency room

## 2016-04-30 ENCOUNTER — Telehealth: Payer: Self-pay | Admitting: *Deleted

## 2016-04-30 ENCOUNTER — Encounter: Payer: Self-pay | Admitting: Neurology

## 2016-04-30 LAB — COMPREHENSIVE METABOLIC PANEL
A/G RATIO: 1.7 (ref 1.2–2.2)
ALBUMIN: 4.5 g/dL (ref 3.5–5.5)
ALK PHOS: 83 IU/L (ref 39–117)
ALT: 11 IU/L (ref 0–32)
AST: 14 IU/L (ref 0–40)
BILIRUBIN TOTAL: 0.8 mg/dL (ref 0.0–1.2)
BUN / CREAT RATIO: 15 (ref 9–23)
BUN: 14 mg/dL (ref 6–24)
CO2: 23 mmol/L (ref 18–29)
Calcium: 9.7 mg/dL (ref 8.7–10.2)
Chloride: 101 mmol/L (ref 96–106)
Creatinine, Ser: 0.91 mg/dL (ref 0.57–1.00)
GFR calc Af Amer: 89 mL/min/{1.73_m2} (ref >59)
GFR calc non Af Amer: 78 mL/min/{1.73_m2} (ref >59)
GLOBULIN, TOTAL: 2.6 g/dL (ref 1.5–4.5)
Glucose: 98 mg/dL (ref 65–99)
POTASSIUM: 4.3 mmol/L (ref 3.5–5.2)
SODIUM: 140 mmol/L (ref 134–144)
Total Protein: 7.1 g/dL (ref 6.0–8.5)

## 2016-04-30 LAB — HIV ANTIBODY (ROUTINE TESTING W REFLEX): HIV Screen 4th Generation wRfx: NONREACTIVE

## 2016-04-30 LAB — CBC
HEMATOCRIT: 35 % (ref 34.0–46.6)
HEMOGLOBIN: 11.3 g/dL (ref 11.1–15.9)
MCH: 23.3 pg — AB (ref 26.6–33.0)
MCHC: 32.3 g/dL (ref 31.5–35.7)
MCV: 72 fL — AB (ref 79–97)
Platelets: 353 10*3/uL (ref 150–379)
RBC: 4.85 x10E6/uL (ref 3.77–5.28)
RDW: 17 % — AB (ref 12.3–15.4)
WBC: 7.7 10*3/uL (ref 3.4–10.8)

## 2016-04-30 LAB — B12 AND FOLATE PANEL
Folate: >20 ng/mL (ref >3.0)
Vitamin B-12: 581 pg/mL (ref 211–946)

## 2016-04-30 LAB — MAGNESIUM: Magnesium: 2 mg/dL (ref 1.6–2.3)

## 2016-04-30 LAB — SJOGREN'S SYNDROME ANTIBODS(SSA + SSB): ENA SSB (LA) Ab: <0.2 AI (ref 0.0–0.9)

## 2016-04-30 LAB — ANA W/REFLEX: Anti Nuclear Antibody(ANA): NEGATIVE

## 2016-04-30 LAB — RPR: RPR: NONREACTIVE

## 2016-04-30 LAB — B. BURGDORFI ANTIBODIES

## 2016-04-30 LAB — SEDIMENTATION RATE: SED RATE: 6 mm/h (ref 0–32)

## 2016-04-30 LAB — RHEUMATOID FACTOR: Rhuematoid fact SerPl-aCnc: <10 IU/mL (ref 0.0–13.9)

## 2016-04-30 LAB — ANGIOTENSIN CONVERTING ENZYME: Angio Convert Enzyme: 31 U/L (ref 14–82)

## 2016-04-30 NOTE — Telephone Encounter (Signed)
-----   Message from Melvenia Beam, MD sent at 04/30/2016  5:35 PM EDT ----- All labs unremarkable

## 2016-04-30 NOTE — Telephone Encounter (Signed)
Called and spoke to pt about unremarkable labs per Dr Jaynee Eagles. Pt has no further questions at this time.

## 2016-05-11 ENCOUNTER — Ambulatory Visit
Admission: RE | Admit: 2016-05-11 | Discharge: 2016-05-11 | Disposition: A | Discharge status: Home / Self Care | Payer: Private Health Insurance - Indemnity | Source: Ambulatory Visit | Attending: Neurology | Admitting: Neurology

## 2016-05-11 DIAGNOSIS — R51 Headache: Secondary | ICD-10-CM | POA: Diagnosis not present

## 2016-05-11 DIAGNOSIS — R208 Other disturbances of skin sensation: Secondary | ICD-10-CM | POA: Diagnosis not present

## 2016-05-11 DIAGNOSIS — F82 Specific developmental disorder of motor function: Secondary | ICD-10-CM

## 2016-05-11 DIAGNOSIS — R202 Paresthesia of skin: Secondary | ICD-10-CM

## 2016-05-11 DIAGNOSIS — R29898 Other symptoms and signs involving the musculoskeletal system: Secondary | ICD-10-CM

## 2016-05-11 DIAGNOSIS — H539 Unspecified visual disturbance: Secondary | ICD-10-CM

## 2016-05-11 DIAGNOSIS — R200 Anesthesia of skin: Secondary | ICD-10-CM

## 2016-05-11 DIAGNOSIS — R519 Headache, unspecified: Secondary | ICD-10-CM

## 2016-05-11 DIAGNOSIS — G519 Disorder of facial nerve, unspecified: Secondary | ICD-10-CM

## 2016-05-11 MED ORDER — GADOBENATE DIMEGLUMINE 529 MG/ML IV SOLN
15.0000 mL | Freq: Once | INTRAVENOUS | Status: AC | PRN
  Administered 2016-05-11: 15 mL via INTRAVENOUS

## 2016-05-13 ENCOUNTER — Telehealth: Payer: Self-pay | Admitting: *Deleted

## 2016-05-13 NOTE — Telephone Encounter (Signed)
LVM for pt about results per Dr Jaynee Eagles note. Gave [facility] phone number if she has further questions.

## 2016-05-13 NOTE — Telephone Encounter (Signed)
-----   Message from Melvenia Beam, MD sent at 05/12/2016 10:18 AM EDT ----- MRI of the brain is unremarkable. Nothing to explain her eye twitching. This is good news, thanks

## 2017-11-18 ENCOUNTER — Encounter: Payer: Self-pay | Admitting: Pulmonary Disease

## 2017-11-23 ENCOUNTER — Institutional Professional Consult (permissible substitution): Payer: Private Health Insurance - Indemnity | Admitting: Pulmonary Disease

## 2017-12-27 ENCOUNTER — Institutional Professional Consult (permissible substitution): Payer: Private Health Insurance - Indemnity | Admitting: Pulmonary Disease

## 2018-01-26 ENCOUNTER — Institutional Professional Consult (permissible substitution): Payer: Private Health Insurance - Indemnity | Admitting: Pulmonary Disease

## 2018-02-03 ENCOUNTER — Ambulatory Visit (INDEPENDENT_AMBULATORY_CARE_PROVIDER_SITE_OTHER): Payer: Private Health Insurance - Indemnity | Admitting: Pulmonary Disease

## 2018-02-03 ENCOUNTER — Encounter: Payer: Self-pay | Admitting: Pulmonary Disease

## 2018-02-03 VITALS — BP 116/74 | HR 81 | Ht 66.5 in | Wt 160.4 lb

## 2018-02-03 DIAGNOSIS — G4730 Sleep apnea, unspecified: Secondary | ICD-10-CM

## 2018-02-03 DIAGNOSIS — G4710 Hypersomnia, unspecified: Secondary | ICD-10-CM

## 2018-02-03 DIAGNOSIS — G4733 Obstructive sleep apnea (adult) (pediatric): Secondary | ICD-10-CM | POA: Diagnosis not present

## 2018-02-03 NOTE — Progress Notes (Signed)
Subjective:    Patient ID: Traci Lopez, female    DOB: 29-Apr-1971, 46 y.o.   MRN: [id number]  HPI  47 year old woman presents to reestablish care for obstructive sleep apnea, primarily to request oral appliance. I have reviewed her prior evaluation by my partner Dr. Rodena Goldmann, she was last seen in 2015 This is summarized below- Multiple sleep studies and MSLT with varying results  Last NPSG 2011:  AHI 3/hr but loud snoring, history c/w UARS    Increased PLMS, but no response to dopamine agonist trial.  Started cpap 2011 with +++response  cpap pressure optimized to 12cm on auto mode, but pt wished to stay on auto.  She has done reasonably well on CPAP and this certainly improved her symptoms of daytime somnolence and fatigue.  She settled with a nasal mask with chinstrap.  Lately she has been ripping off the mask sometime during the night and feels has not been using much.  She remains tired and has occasional gasping episodes that wake her up from sleep  Epworth sleepiness score is 8. 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 Bedtime is between 10:11 PM, sleep latency is minimal, she sleeps on her side with one pillow, reports several nocturnal awakenings almost always around 4 AM and is out of bed by 645 feeling tired with occasional dryness of mouth. Her weight today is at its lowest at 160 pounds I note that in 2013 she was as high as 210 pounds in 2015 she was 174 pounds.  There is no history suggestive of cataplexy, sleep paralysis or parasomnias  She was diagnosed as Hashimoto's and is on thyroid supplements.  She is married and lives with her husband and 2 kids, snoring has been noted by her husband.  She is adopted so no family history is available   Significant tests/ events reviewed  NPSG 2009 >> AHI of 0.6/hr, but had large numbers of PLMS with 12/hr resulting in arousal or awakening.    2009 MSLT >>mean sleep latency of 8.94min, and no SOREMs noted.    09/2009 NPSG >> AHI of 0.9/hr, 110 leg jerks with 5/hr. causing arousal  2010 MSLT with a 1.2min mean sleep onset latency, and 2 SOREMs.    01/2010 AHI of 3/hr.    Past Medical History:  Diagnosis Date  . Fatty liver disease, nonalcoholic   . Hashimoto's disease   . Hyperlipidemia   . Hypothyroidism   . Multiple pregnancy    g2 p2  . OSA (obstructive sleep apnea)    on CPAP  . Pyelonephritis       Past Surgical History:  Procedure Laterality Date  . SEPTOPLASTY  12/2008  . WISDOM TOOTH EXTRACTION      No Known Allergies   Social History   Socioeconomic History  . Marital status: Married    Spouse name: Josh  . Number of children: 2  . Years of education: BA  . Highest education level: Not on file  Occupational History  . Occupation: stay at home mom  Social Needs  . Financial resource strain: Not on file  . Food insecurity:    Worry: Not on file    Inability: Not on file  . Transportation needs:    Medical: Not on file    Non-medical: Not on file  Tobacco Use  . Smoking status: Former Smoker  Packs/day: 1.00    Years: 10.00    Pack years: 10.00    Types: Cigarettes    Last attempt to quit: 11/16/2000    Years since quitting: 17.2  . Smokeless tobacco: Never Used  Substance and Sexual Activity  . Alcohol use: No  . Drug use: Not on file  . Sexual activity: Not on file  Lifestyle  . Physical activity:    Days per week: Not on file    Minutes per session: Not on file  . Stress: Not on file  Relationships  . Social connections:    Talks on phone: Not on file    Gets together: Not on file    Attends religious service: Not on file    Active member of club or organization: Not on file    Attends meetings of clubs or organizations: Not on file    Relationship status: Not on file  . Intimate partner violence:    Fear of current or ex partner: Not on file    Emotionally abused: Not  on file    Physically abused: Not on file    Forced sexual activity: Not on file  Other Topics Concern  . Not on file  Social History Narrative   Lives spouse   Caffeine use:  none   Regular exercise - 3x per week      Family History  Adopted: Yes  Problem Relation Age of Onset  . Neuropathy Neg Hx   . Seizures Neg Hx   . Migraines Neg Hx        Past Medical History:  Diagnosis Date  . Fatty liver disease, nonalcoholic   . Hashimoto's disease   . Hyperlipidemia   . Hypothyroidism   . Multiple pregnancy    g2 p2  . OSA (obstructive sleep apnea)    on CPAP  . Pyelonephritis      Review of Systems  Positive for shortness of breath with activity, weight loss, nasal congestion, joint stiffness  Constitutional: negative for anorexia, fevers and sweats  Eyes: negative for irritation, redness and visual disturbance  Ears, nose, mouth, throat, and face: negative for earaches, epistaxis, nasal congestion and sore throat  Respiratory: negative for cough,  sputum and wheezing  Cardiovascular: negative for chest pain, dyspnea, lower extremity edema, orthopnea, palpitations and syncope  Gastrointestinal: negative for abdominal pain, constipation, diarrhea, melena, nausea and vomiting  Genitourinary:negative for dysuria, frequency and hematuria  Hematologic/lymphatic: negative for bleeding, easy bruising and lymphadenopathy  Musculoskeletal:negative for arthralgias, muscle weakness and stiff joints  Neurological: negative for coordination problems, gait problems, headaches and weakness  Endocrine: negative for diabetic symptoms including polydipsia, polyuria and weight loss     Objective:   Physical Exam  Gen. Pleasant, well-nourished, in no distress, normal affect ENT - underbite, no post nasal drip, class 2 airway Neck: No JVD, no thyromegaly, no carotid bruits Lungs: no use of accessory muscles, no dullness to percussion, clear without rales or rhonchi    Cardiovascular: Rhythm regular, heart sounds  normal, no murmurs or gallops, no peripheral edema Abdomen: soft and non-tender, no hepatosplenomegaly, BS normal. Musculoskeletal: No deformities, no cyanosis or clubbing Neuro:  alert, non focal        Assessment & Plan:

## 2018-02-03 NOTE — Assessment & Plan Note (Addendum)
Doubt narcolepsy after reviewing priorMSLT

## 2018-02-03 NOTE — Patient Instructions (Signed)
Please schedule home sleep study. Can make contact with Dr. Oneal Grout or Dr. Marice Potter for oral appliance  But suggest repeating home sleep study based on baseline results after maximum advancement of device

## 2018-02-03 NOTE — Assessment & Plan Note (Signed)
Seems to be related to UA RS/upper airway resistance syndrome .  Unlikely to be narcolepsy on review of prior M SLT's Please schedule home sleep study. She would be a good candidate for oral appliance or EZ Pap  But suggest repeating home sleep study based on baseline results after maximum advancement of device

## 2018-08-01 ENCOUNTER — Encounter

## 2018-08-01 ENCOUNTER — Ambulatory Visit: Payer: No Typology Code available for payment source | Admitting: Neurology

## 2018-08-08 ENCOUNTER — Telehealth: Payer: Self-pay | Admitting: Pulmonary Disease

## 2018-08-08 NOTE — Telephone Encounter (Signed)
I called Mrs. Rey and she is scheduled on 08/15/2018 @ 2:30pm to pick up [place]

## 2018-08-16 ENCOUNTER — Other Ambulatory Visit: Payer: Self-pay | Admitting: *Deleted

## 2018-08-16 DIAGNOSIS — G4733 Obstructive sleep apnea (adult) (pediatric): Secondary | ICD-10-CM

## 2018-08-24 DIAGNOSIS — G4733 Obstructive sleep apnea (adult) (pediatric): Secondary | ICD-10-CM | POA: Diagnosis not present

## 2018-08-25 ENCOUNTER — Telehealth: Payer: Self-pay | Admitting: Pulmonary Disease

## 2018-08-25 DIAGNOSIS — G4733 Obstructive sleep apnea (adult) (pediatric): Secondary | ICD-10-CM | POA: Diagnosis not present

## 2018-08-25 NOTE — Telephone Encounter (Signed)
Per RA, HST showed mild OSA with 6 events per hour. Events increase when sleeping in the supine position. It is ok to proceed with oral appliance, try not to sleep in supine position.

## 2018-08-30 NOTE — Telephone Encounter (Signed)
Spoke with patient. She is aware of results. She wishes to proceed with the referral to Dr. Oneal Grout. Advised her I would place the order today. Nothing further needed at time of call.

## 2018-09-06 ENCOUNTER — Encounter: Payer: Self-pay | Admitting: *Deleted

## 2018-09-07 ENCOUNTER — Telehealth: Payer: Self-pay | Admitting: Neurology

## 2018-09-07 ENCOUNTER — Encounter: Payer: Self-pay | Admitting: Neurology

## 2018-09-07 ENCOUNTER — Ambulatory Visit (INDEPENDENT_AMBULATORY_CARE_PROVIDER_SITE_OTHER): Payer: No Typology Code available for payment source | Admitting: Neurology

## 2018-09-07 VITALS — BP 117/82 | HR 84 | Ht 66.5 in | Wt 178.0 lb

## 2018-09-07 DIAGNOSIS — R253 Fasciculation: Secondary | ICD-10-CM

## 2018-09-07 DIAGNOSIS — R413 Other amnesia: Secondary | ICD-10-CM | POA: Diagnosis not present

## 2018-09-07 DIAGNOSIS — R4701 Aphasia: Secondary | ICD-10-CM | POA: Diagnosis not present

## 2018-09-07 DIAGNOSIS — R251 Tremor, unspecified: Secondary | ICD-10-CM | POA: Diagnosis not present

## 2018-09-07 NOTE — Telephone Encounter (Signed)
Spoke to patient she is aware of this. She also has their number of [phone] and to call them if she has not heard from them in the next 2-3 business days.

## 2018-09-07 NOTE — Patient Instructions (Addendum)
MRI of the brain Speech referral   Benign Essential Blepharospasm, Adult Benign essential blepharospasm (BEB) is a nervous system condition that makes a person close his or her eyes without meaning to. Over time, episodes of this condition may gradually become more frequent and forceful, and eventually involve both eyes. This can make it hard for you to keep your eyes open and do activities such as watching television, driving, or reading. If this condition is not treated, the eyes may close forcefully for long periods of time. What are the causes? The exact cause of this condition is not known. The condition may be passed down through families through an abnormal gene. Symptoms of the condition may be triggered by:  The wind.  Sunlight.  Noise.  Stress.  Fatigue.  Bright light.  Reading.  Driving.  Walking outside.  Air pollution.  Eye strain.  What increases the risk? You are more likely to develop this condition if:  You are age 22 or older.  You have a family history of the condition.  You are female.  There are problems with the part of your brain that controls movements (basal ganglia).  You have a movement disorder called dystonia.  You have a history of eye diseases or trauma to the eye(s).  You take certain medicines, such as medicines that treat Parkinson disease.  What are the signs or symptoms? The first symptom of this condition is frequent eye blinking or twitching that you cannot control. It may happen during the day and disappear at night. Other early symptoms include:  Eye dryness and irritation.  Eye irritation or pain from bright lights (photophobia).  You may get temporary relief from your symptoms when you sing, yawn, chew, or laugh. Later symptoms of this condition include:  Winking and squinting for longer than usual.  Muscle spasms in your tongue and jaw (Meigesyndrome).  Inability to keep your eyes open for long periods of  time.  Over time, symptoms may get stronger and last longer. How is this diagnosed? This condition is diagnosed based on your symptoms, your medical history, and a physical exam. How is this treated? There is no cure for this condition, but treatment can help with symptoms. Treatment options include:  Applying moisturizing eye drops (artificial tears) to the eye. These drops help to relieve eye irritation and dryness.  Getting an injection of botulinum toxin into the muscles that control eyelid movement. This treatment may need to be repeated every few months.  Taking medicines such as muscle relaxants and anti-anxiety medicines.  Having surgery to remove part of the eyelid muscles (myectomy). This may be done if injections of botulinum toxin do not work or they stop working.  Follow these instructions at home: Lifestyle  Wear tinted sunglasses that block UV (ultraviolet) light.  Wear eye protection outdoors on windy days.  Get enough sleep.  Try to manage or avoid stressful situations.  Do not watch TV or have screen time for long periods of time.  Avoid things that trigger your condition. General instructions  Learn as much as you can about your condition.  Work closely with your team of health care providers.  Use over-the-counter and prescription medicines, including eye drops, only as told by your health care provider.  Keep all follow-up visits as told by your health care provider. This is important.  Do not drive if you are having an episode that affects how well you can see.  Keep your eyelids clean. Wash them daily with  mild soap and water. This will help to prevent irritation and infection. Contact a health care provider if:  Your symptoms are not controlled with treatment.  Your eyes are red, teary, or dry.  Your eyes droop.  You feel anxious or depressed. Get help right away if:  You cannot open your eyes. Summary  Benign essential blepharospasm  (BEB) is a nervous system condition that makes a person close his or her eyes without meaning to.  The exact cause of this condition is not known. The condition may be passed down through families through an abnormal gene.  There is no cure for this condition, but treatment can help with symptoms. This information is not intended to replace advice given to you by your health care provider. Make sure you discuss any questions you have with your health care provider. Document Released: 10/23/2002 Document Revised: 01/05/2017 Document Reviewed: 01/05/2017 Elsevier Interactive Patient Education  2018 [employer].   Essential Tremor A tremor is trembling or shaking that you cannot control. Most tremors affect the hands or arms. Tremors can also affect the head, vocal cords, face, and other parts of the body. Essential tremor is a tremor without a known cause. What are the causes? Essential tremor has no known cause. What increases the risk? You may be at greater risk of essential tremor if:  You have a family member with essential tremor.  You are age 19 or older.  You take certain medicines.  What are the signs or symptoms? The main sign of a tremor is uncontrolled and unintentional rhythmic shaking of a body part.  You may have difficulty eating with a spoon or fork.  You may have difficulty writing.  You may nod your head up and down or side to side.  You may have a quivering voice.  Your tremors:  May get worse over time.  May come and go.  May be more noticeable on one side of your body.  May get worse due to stress, fatigue, caffeine, and extreme heat or cold.  How is this diagnosed? Your health care provider can diagnose essential tremor based on your symptoms, medical history, and a physical examination. There is no single test to diagnose an essential tremor. However, your health care provider may perform a variety of tests to rule out other conditions. Tests may  include:  Blood and urine tests.  Imaging studies of your brain, such as: ? CT scan. ? MRI.  A test that measures involuntary muscle movement (electromyogram).  How is this treated? Your tremors may go away without treatment. Mild tremors may not need treatment if they do not affect your day-to-day life. Severe tremors may need to be treated using one or a combination of the following options:  Medicines. This may include medicine that is injected.  Lifestyle changes.  Physical therapy.  Follow these instructions at home:  Take medicines only as directed by your health care provider.  Limit alcohol intake to no more than 1 drink per day for nonpregnant women and 2 drinks per day for men. One drink equals 12 oz of beer, 5 oz of wine, or 1 oz of hard liquor.  Do not use any tobacco products, including cigarettes, chewing tobacco, or electronic cigarettes. If you need help quitting, ask your health care provider.  Take medicines only as directed by your health care provider.  Avoid extreme heat or cold.  Limit the amount of caffeine you consumeas directed by your health care provider.  Try  to get eight hours of sleep each night.  Find ways to manage your stress, such as meditation or yoga.  Keep all follow-up visits as directed by your health care provider. This is important. This includes any physical therapy visits. Contact a health care provider if:  You experience any changes in the location or intensity of your tremors.  You start having a tremor after starting a new medicine.  You have tremor with other symptoms such as: ? Numbness. ? Tingling. ? Pain. ? Weakness.  Your tremor gets worse.  Your tremor interferes with your daily life. This information is not intended to replace advice given to you by your health care provider. Make sure you discuss any questions you have with your health care provider. Document Released: 11/23/2014 Document Revised:  04/09/2016 Document Reviewed: 04/30/2014 Elsevier Interactive Patient Education  2018 [employer].   Tremor A tremor is trembling or shaking that you cannot control. Most tremors affect the hands or arms. Tremors can also affect the head, vocal cords, face, and other parts of the body. There are many types of tremors. Common types include:  Essential tremor. These usually occur in people over the age of 89. It may run in families and can happen in otherwise healthy people.  Resting tremor. These occur when the muscles are at rest, such as when your hands are resting in your lap. People with Parkinson disease often have resting tremors.  Postural tremor. These occur when you try to hold a pose, such as keeping your hands outstretched.  Kinetic tremor. These occur during purposeful movement, such as trying to touch a finger to your nose.  Task-specific tremor. These may occur when you perform tasks such as handwriting, speaking, or standing.  Psychogenic tremor. These dramatically lessen or disappear when you are distracted. They can happen in people of all ages.  Some types of tremors have no known cause. Tremors can also be a symptom of nervous system problems (neurological disorders) that may occur with aging. Some tremors go away with treatment while others do not. Follow these instructions at home: Watch your tremor for any changes. The following actions may help to lessen any discomfort you are feeling:  Take medicines only as directed by your health care provider.  Limit alcohol intake to no more than 1 drink per day for nonpregnant women and 2 drinks per day for men. One drink equals 12 oz of beer, 5 oz of wine, or 1 oz of hard liquor.  Do not use any tobacco products, including cigarettes, chewing tobacco, or electronic cigarettes. If you need help quitting, ask your health care provider.  Avoid extreme heat or cold.  Limit the amount of caffeine you consumeas directed by  your health care provider.  Try to get 8 hours of sleep each night.  Find ways to manage your stress, such as meditation or yoga.  Keep all follow-up visits as directed by your health care provider. This is important.  Contact a health care provider if:  You start having a tremor after starting a new medicine.  You have tremor with other symptoms such as: ? Numbness. ? Tingling. ? Pain. ? Weakness.  Your tremor gets worse.  Your tremor interferes with your day-to-day life. This information is not intended to replace advice given to you by your health care provider. Make sure you discuss any questions you have with your health care provider. Document Released: 10/23/2002 Document Revised: 07/05/2016 Document Reviewed: 04/30/2014 Elsevier Interactive Patient Education  2018 [employer].

## 2018-09-07 NOTE — Progress Notes (Signed)
[facility]    Provider:  Dr Jaynee Eagles Referring Provider: Lona Kettle MD Primary Care Physician:  Lawerance Cruel, MD  CC:  Central auditory processing disorder  Interval history 09/07/2018: Patient returns with a new request from Dr. Harrington Challenger to evaluate central auditory processing disorder. PMHx sleep problems, hypothyroidism, hld, depression, osa on cpap, vit d def, kidney stones, hashimotos thyroiditis, central processing disorder. She misinterprets children's phrases. Patient reports left side of face twitching. She sometimes doesn't has difficulty understanding when people talk to her, sporadic, sometimes only with her kids and maybe husband they will ask her a question and she will interpret as something completely different. They may ask for milk and she hear what time is it and answers inappropriate. Has happened several times. No alteration of awareness. She remembers everything, no confusion. Still has left eye twitching and postural tremor, stable.No other focal neurologic deficits, associated symptoms, inciting events or modifiable factors.  Reviewed Dr. Harrington Challenger' notes. She is misinterpreting children's phrases. Believes she has a central auditory processing disorder. She also has twitching ans tightness in her shoulders and warmth above left eye. She has a cleep disorder, hypothyroidism, depression.   HPI:  Traci Lopez is a lovely 47 y.o. female here as a referral from Dr. Harrington Challenger for eye twitching. She has a past medical history of Hashimoto's disease, hyperlipidemia, fatty liver nonalcoholic, obstructive sleep apnea on CPAP, lumbar disc degeneration, thoracic spine pain, cervical pain, chronic midline low back pain, occipital neuralgia of the right side. She feels muscle twitching in the left eye, she has a growth in her left temple area dxed as fatty tissue, twitching within the last year, on and off, worse with stress or being tired, not on the right side. The twitching  can last a day or two a few seconds then may repeat, no pain, no double vision but does have blurry vision changes, she is having headaches new onset never had headaches before, pressure at the temples and on the left side, no light or sound sensitivity, no nausea or vomiting, not positional, does endorse vision changes, headaches daily. Headaches daily, pressure, +vision changes in the left eye.No hearing changes, she has fine motor difficulty on the left which is new. No facial weakness. Tremors of the left hand, motor changes possible weakness as well. Gradually worsening. Left sided paresthesias in the face. No inciting event for these symptoms, no head trauma or otherwise. No triggers. No other focal neurologic deficits or symptoms or complaints. No family history of neuromuscular disorder.  Reviewed notes, labs and imaging from outside physicians, which showed: Reviewed notes from [facility] orthopedics. Patient is a 47 year old who is following their further back. She was evaluated for left shoulder pain and mid back symptoms rating up to the left shoulder and not getting any better. Previous year she was having neck and head pain which has improved. She has persistent issues with the left side of the thoracic spine. She has had physical therapy. Physical therapy did not help her. She continues to exercise at home. She sees a Restaurant manager, fast food as well as a massage therapist. She also has some neck and lower back pain. No bowel or bladder dysfunction. No fevers chills or weight loss. MRI of the thoracic spine was recommended.  Review of Systems: Patient complains of symptoms per HPI as well as the following symptoms: joint pain, memory loss, tremor. Pertinent negatives per HPI. All others negative.   Social History   Socioeconomic History  . Marital status:  Married    Spouse name: Traci Lopez  . Number of children: 2  . Years of education: BA  . Highest education level: Not on file  Occupational History    . Occupation: stay at home mom  Social Needs  . Financial resource strain: Not on file  . Food insecurity:    Worry: Not on file    Inability: Not on file  . Transportation needs:    Medical: Not on file    Non-medical: Not on file  Tobacco Use  . Smoking status: Former Smoker    Packs/day: 1.00    Years: 10.00    Pack years: 10.00    Types: Cigarettes    Last attempt to quit: 2003    Years since quitting: 16.8  . Smokeless tobacco: Never Used  Substance and Sexual Activity  . Alcohol use: Never    Frequency: Never  . Drug use: Never  . Sexual activity: Yes    Partners: Male    Birth control/protection: None  Lifestyle  . Physical activity:    Days per week: Not on file    Minutes per session: Not on file  . Stress: Not on file  Relationships  . Social connections:    Talks on phone: Not on file    Gets together: Not on file    Attends religious service: Not on file    Active member of club or organization: Not on file    Attends meetings of clubs or organizations: Not on file    Relationship status: Not on file  . Intimate partner violence:    Fear of current or ex partner: Not on file    Emotionally abused: Not on file    Physically abused: Not on file    Forced sexual activity: Not on file  Other Topics Concern  . Not on file  Social History Narrative   Lives with spouse and 2 children   Caffeine use:  none   Regular exercise - 3x per week    Family History  Adopted: Yes  Problem Relation Age of Onset  . Neuropathy Neg Hx   . Seizures Neg Hx   . Migraines Neg Hx     Past Medical History:  Diagnosis Date  . Anxiety   . Depression   . Fatty liver disease, nonalcoholic   . Hashimoto's disease   . Hematochezia   . Hyperlipidemia   . Hypothyroidism   . Iron deficiency   . Kidney stones   . Multiple pregnancy    g2 p2  . OSA (obstructive sleep apnea)    on CPAP  . Pyelonephritis   . Sleep disorder   . Vitamin D deficiency     Past Surgical  History:  Procedure Laterality Date  . SEPTOPLASTY  12/2008  . WISDOM TOOTH EXTRACTION      Current Outpatient Medications  Medication Sig Dispense Refill  . buPROPion (WELLBUTRIN XL) 300 MG 24 hr tablet Take 300 mg by mouth daily.    . cetirizine (ZYRTEC) 10 MG tablet Take 10 mg by mouth daily.    . metaxalone (SKELAXIN) 800 MG tablet Take 800 mg by mouth 3 (three) times daily as needed (tight muscles).    . SYNTHROID 150 MCG tablet Take 150 mcg by mouth daily before breakfast.      No current facility-administered medications for this visit.     Allergies as of 09/07/2018 - Review Complete 09/07/2018  Allergen Reaction Noted  . Sulfites Diarrhea, Itching, and Swelling  09/06/2018    Vitals: BP 117/82 (BP Location: Right Arm, Patient Position: Sitting)   Pulse 84   Ht 5' 6.5" (1.689 m)   Wt 178 lb (80.7 kg)   LMP 08/24/2018 (Approximate)   BMI 28.30 kg/m  Last Weight:  Wt Readings from Last 1 Encounters:  09/07/18 178 lb (80.7 kg)   Last Height:   Ht Readings from Last 1 Encounters:  09/07/18 5' 6.5" (1.689 m)    Physical exam: Exam: Gen: NAD, conversant, well nourised, Overweight, well groomed                     CV: RRR, no MRG. No Carotid Bruits. No peripheral edema, warm, nontender Eyes: Conjunctivae clear without exudates or hemorrhage  Neuro: Detailed Neurologic Exam  Speech:    Speech is normal; fluent and spontaneous with normal comprehension.  Cognition:    The patient is oriented to person, place, and time;     recent and remote memory intact;     language fluent;     normal attention, concentration,     fund of knowledge Cranial Nerves:    The pupils are equal, round, and reactive to light. The fundi are normal and spontaneous venous pulsations are present. Visual fields are full to finger confrontation. Extraocular movements are intact. Trigeminal sensation is intact and the muscles of mastication are normal. The face is symmetric. The palate  elevates in the midline. Hearing intact. Voice is normal. Shoulder shrug is normal. The tongue has normal motion without fasciculations.   Coordination:    Normal finger to nose and heel to shin. Normal rapid alternating movements.   Gait:    Heel-toe and tandem gait are normal.   Motor Observation:    No asymmetry, no atrophy, and postural tremor (high frequency low amplitude) Tone:    Normal muscle tone.    Posture:    Posture is normal. normal erect    Strength:    Strength is V/V in the upper and lower limbs.      Sensation: intact to LT     Reflex Exam:  DTR's:    Deep tendon reflexes in the upper and lower extremities are normal bilaterally.   Toes:    The toes are downgoing bilaterally.   Clonus:    Clonus is absent.      Assessment/Plan:  This is a very nice 47 year old female who was evaluated in the past for left eye twitching, left facial numbness and paresthesias, workup in the past was unremarkable; MRI of the brain and labwork 2017 unremarkable.  Here for new problem difficulties with receptive speech.   - We don't treat central auditory processing order in neurology but can MRI the brain and ensure there are not  any other central etiologies such as aphasia due to a stroke.   - Will refer to speech therapy for a possible central auditory processing disorder.  - Left postural tremor may be medication related or essential tremor, not concerning at this time, table  - eye twitching:  Stable. Workup in the past here at [facility] (2017) was unremarkable (MRI brain and labs serum evaluation including ANA, sarcoid, Lyme, B12, CBC, CMP, HIV, magnesium, rheumatoid factor, RPR, sedimentation rate and Sjogren's). We recommended Botox in the past if impairing vision. Of note no twitching seen on previous or current exam. Reassured patient, may be a component of anxiety.  Orders Placed This Encounter  Procedures  . MR BRAIN W WO CONTRAST  . Basic  Metabolic Panel  .  Ambulatory referral to Speech Therapy    Cc: Dr. Lovena Le, [facility] [address] [place], [place] [address]  Phone [phone] Fax [phone]

## 2018-09-07 NOTE — Telephone Encounter (Signed)
[employer] order sen tot GI. They obtain the auth and will reach out to the pt to schedule.

## 2018-10-18 ENCOUNTER — Other Ambulatory Visit: Payer: Self-pay | Admitting: Obstetrics and Gynecology

## 2018-10-18 DIAGNOSIS — Z1231 Encounter for screening mammogram for malignant neoplasm of breast: Secondary | ICD-10-CM

## 2018-10-28 ENCOUNTER — Other Ambulatory Visit: Payer: Self-pay | Admitting: Neurology

## 2018-10-31 ENCOUNTER — Other Ambulatory Visit: Payer: Private Health Insurance - Indemnity

## 2018-11-28 ENCOUNTER — Ambulatory Visit: Payer: Private Health Insurance - Indemnity

## 2018-12-07 ENCOUNTER — Telehealth: Payer: Self-pay | Admitting: Pulmonary Disease

## 2018-12-07 DIAGNOSIS — G4730 Sleep apnea, unspecified: Secondary | ICD-10-CM

## 2018-12-07 NOTE — Telephone Encounter (Signed)
Okay to send referral to her dentist please. Please send copy of her sleep study

## 2018-12-07 NOTE — Telephone Encounter (Signed)
Order placed for referral. [facility] will fax a copy of the sleep study. Nothing further is needed at this time. Call made to patient to make aware.

## 2018-12-07 NOTE — Telephone Encounter (Signed)
Please call patient back once referral has been placed at patients request.   Call made to patient, she states she was seen by Dr. Ron Parker office and the never reached out to her after several attempts have been made to reach out to him. She no longer wants to do business with them and wishes to have her referral sent to her dentist, Dorann Lodge.   Phone [phone] Fax [phone]  RA please advise if okay to place order for referral. Thanks.

## 2019-01-06 ENCOUNTER — Other Ambulatory Visit: Payer: Self-pay | Admitting: Family Medicine

## 2019-01-06 DIAGNOSIS — R109 Unspecified abdominal pain: Secondary | ICD-10-CM

## 2019-01-10 ENCOUNTER — Ambulatory Visit
Admission: RE | Admit: 2019-01-10 | Discharge: 2019-01-10 | Disposition: A | Discharge status: Home / Self Care | Payer: Private Health Insurance - Indemnity | Source: Ambulatory Visit | Attending: Family Medicine | Admitting: Family Medicine

## 2019-01-10 DIAGNOSIS — R109 Unspecified abdominal pain: Secondary | ICD-10-CM

## 2019-01-10 MED ORDER — IOPAMIDOL (ISOVUE-300) INJECTION 61%
100.0000 mL | Freq: Once | INTRAVENOUS | Status: AC | PRN
  Administered 2019-01-10: 100 mL via INTRAVENOUS

## 2019-02-10 ENCOUNTER — Ambulatory Visit: Payer: Private Health Insurance - Indemnity

## 2019-05-11 ENCOUNTER — Other Ambulatory Visit: Payer: Self-pay

## 2019-05-11 ENCOUNTER — Ambulatory Visit
Admission: RE | Admit: 2019-05-11 | Discharge: 2019-05-11 | Disposition: A | Discharge status: Home / Self Care | Payer: 59 | Source: Ambulatory Visit | Attending: Obstetrics and Gynecology | Admitting: Obstetrics and Gynecology

## 2019-05-11 DIAGNOSIS — Z1231 Encounter for screening mammogram for malignant neoplasm of breast: Secondary | ICD-10-CM

## 2019-05-15 ENCOUNTER — Other Ambulatory Visit: Payer: Self-pay | Admitting: Obstetrics and Gynecology

## 2019-05-15 DIAGNOSIS — R928 Other abnormal and inconclusive findings on diagnostic imaging of breast: Secondary | ICD-10-CM

## 2019-05-18 ENCOUNTER — Ambulatory Visit: Payer: 59

## 2019-05-18 ENCOUNTER — Other Ambulatory Visit: Payer: Self-pay

## 2019-05-18 ENCOUNTER — Ambulatory Visit
Admission: RE | Admit: 2019-05-18 | Discharge: 2019-05-18 | Disposition: A | Discharge status: Home / Self Care | Payer: 59 | Source: Ambulatory Visit | Attending: Obstetrics and Gynecology | Admitting: Obstetrics and Gynecology

## 2019-05-18 DIAGNOSIS — R928 Other abnormal and inconclusive findings on diagnostic imaging of breast: Secondary | ICD-10-CM

## 2019-06-08 ENCOUNTER — Other Ambulatory Visit: Payer: Self-pay

## 2019-06-08 DIAGNOSIS — Z20822 Contact with and (suspected) exposure to covid-19: Secondary | ICD-10-CM

## 2019-06-13 LAB — NOVEL CORONAVIRUS, NAA: SARS-CoV-2, NAA: NOT DETECTED

## 2020-02-01 ENCOUNTER — Ambulatory Visit: Payer: 59 | Attending: Internal Medicine

## 2020-02-01 DIAGNOSIS — Z23 Encounter for immunization: Secondary | ICD-10-CM

## 2020-02-01 NOTE — Progress Notes (Signed)
   Covid-19 Vaccination Clinic  Name:  Traci Lopez    MRN: [id number] DOB: 1971/11/14  02/01/2020  [name] was observed post Covid-19 immunization for 15 minutes without incident. She was provided with Vaccine Information Sheet and instruction to access the V-Safe system.   [name] was instructed to call 911 with any severe reactions post vaccine: Marland Kitchen Difficulty breathing  . Swelling of face and throat  . A fast heartbeat  . A bad rash all over body  . Dizziness and weakness   Immunizations Administered    Name Date Dose VIS Date Route   Pfizer COVID-19 Vaccine 02/01/2020 11:32 AM 0.3 mL 10/27/2019 Intramuscular   Manufacturer: [place]   Lot: [id number]   [place]: [id number]

## 2020-02-26 ENCOUNTER — Ambulatory Visit: Payer: 59 | Attending: Internal Medicine

## 2020-02-26 DIAGNOSIS — Z23 Encounter for immunization: Secondary | ICD-10-CM

## 2020-02-26 NOTE — Progress Notes (Signed)
   Covid-19 Vaccination Clinic  Name:  Traci Lopez    MRN: [id number] DOB: 07-20-1971  02/26/2020  [name] was observed post Covid-19 immunization for 15 minutes without incident. She was provided with Vaccine Information Sheet and instruction to access the V-Safe system.   [name] was instructed to call 911 with any severe reactions post vaccine: Marland Kitchen Difficulty breathing  . Swelling of face and throat  . A fast heartbeat  . A bad rash all over body  . Dizziness and weakness   Immunizations Administered    Name Date Dose VIS Date Route   Pfizer COVID-19 Vaccine 02/26/2020 10:33 AM 0.3 mL 10/27/2019 Intramuscular   Manufacturer: [place]   Lot: [id number]   [place]: [id number]

## 2020-06-10 ENCOUNTER — Other Ambulatory Visit: Payer: Self-pay | Admitting: Obstetrics and Gynecology

## 2020-06-10 DIAGNOSIS — Z1231 Encounter for screening mammogram for malignant neoplasm of breast: Secondary | ICD-10-CM

## 2020-06-18 ENCOUNTER — Ambulatory Visit
Admission: RE | Admit: 2020-06-18 | Discharge: 2020-06-18 | Disposition: A | Discharge status: Home / Self Care | Payer: 59 | Source: Ambulatory Visit | Attending: Obstetrics and Gynecology | Admitting: Obstetrics and Gynecology

## 2020-06-18 ENCOUNTER — Other Ambulatory Visit: Payer: Self-pay

## 2020-06-18 DIAGNOSIS — Z1231 Encounter for screening mammogram for malignant neoplasm of breast: Secondary | ICD-10-CM

## 2020-06-21 ENCOUNTER — Other Ambulatory Visit: Payer: Self-pay | Admitting: Obstetrics and Gynecology

## 2020-06-21 DIAGNOSIS — R928 Other abnormal and inconclusive findings on diagnostic imaging of breast: Secondary | ICD-10-CM

## 2020-07-03 ENCOUNTER — Ambulatory Visit
Admission: RE | Admit: 2020-07-03 | Discharge: 2020-07-03 | Disposition: A | Discharge status: Home / Self Care | Payer: No Typology Code available for payment source | Source: Ambulatory Visit | Attending: Obstetrics and Gynecology | Admitting: Obstetrics and Gynecology

## 2020-07-03 ENCOUNTER — Other Ambulatory Visit: Payer: Self-pay

## 2020-07-03 ENCOUNTER — Other Ambulatory Visit: Payer: Self-pay | Admitting: Obstetrics and Gynecology

## 2020-07-03 DIAGNOSIS — R928 Other abnormal and inconclusive findings on diagnostic imaging of breast: Secondary | ICD-10-CM

## 2020-07-03 DIAGNOSIS — N6320 Unspecified lump in the left breast, unspecified quadrant: Secondary | ICD-10-CM

## 2020-07-15 ENCOUNTER — Other Ambulatory Visit: Payer: Self-pay | Admitting: Obstetrics and Gynecology

## 2020-07-15 ENCOUNTER — Other Ambulatory Visit: Payer: Self-pay

## 2020-07-15 ENCOUNTER — Ambulatory Visit
Admission: RE | Admit: 2020-07-15 | Discharge: 2020-07-15 | Disposition: A | Discharge status: Home / Self Care | Payer: No Typology Code available for payment source | Source: Ambulatory Visit | Attending: Obstetrics and Gynecology | Admitting: Obstetrics and Gynecology

## 2020-07-15 DIAGNOSIS — N6320 Unspecified lump in the left breast, unspecified quadrant: Secondary | ICD-10-CM

## 2020-07-24 ENCOUNTER — Ambulatory Visit
Admission: RE | Admit: 2020-07-24 | Discharge: 2020-07-24 | Disposition: A | Discharge status: Home / Self Care | Payer: No Typology Code available for payment source | Source: Ambulatory Visit | Attending: Obstetrics and Gynecology | Admitting: Obstetrics and Gynecology

## 2020-07-24 ENCOUNTER — Other Ambulatory Visit: Payer: Self-pay

## 2020-07-24 DIAGNOSIS — N6320 Unspecified lump in the left breast, unspecified quadrant: Secondary | ICD-10-CM

## 2020-07-24 HISTORY — PX: BREAST BIOPSY: SHX20

## 2020-12-17 ENCOUNTER — Other Ambulatory Visit: Payer: Self-pay | Admitting: Obstetrics and Gynecology

## 2020-12-17 DIAGNOSIS — D242 Benign neoplasm of left breast: Secondary | ICD-10-CM

## 2020-12-31 ENCOUNTER — Ambulatory Visit
Admission: RE | Admit: 2020-12-31 | Discharge: 2020-12-31 | Disposition: A | Discharge status: Home / Self Care | Payer: No Typology Code available for payment source | Source: Ambulatory Visit | Attending: Obstetrics and Gynecology | Admitting: Obstetrics and Gynecology

## 2020-12-31 ENCOUNTER — Other Ambulatory Visit: Payer: Self-pay

## 2020-12-31 DIAGNOSIS — D242 Benign neoplasm of left breast: Secondary | ICD-10-CM

## 2021-03-19 ENCOUNTER — Other Ambulatory Visit: Payer: Self-pay | Admitting: Family Medicine

## 2021-03-19 DIAGNOSIS — Z1231 Encounter for screening mammogram for malignant neoplasm of breast: Secondary | ICD-10-CM

## 2021-03-19 DIAGNOSIS — E2839 Other primary ovarian failure: Secondary | ICD-10-CM

## 2021-03-27 ENCOUNTER — Other Ambulatory Visit: Payer: Self-pay

## 2021-03-27 ENCOUNTER — Ambulatory Visit
Admission: RE | Admit: 2021-03-27 | Discharge: 2021-03-27 | Disposition: A | Discharge status: Home / Self Care | Payer: No Typology Code available for payment source | Source: Ambulatory Visit | Attending: Family Medicine | Admitting: Family Medicine

## 2021-03-27 DIAGNOSIS — E2839 Other primary ovarian failure: Secondary | ICD-10-CM

## 2021-03-29 IMAGING — MG MM BREAST BX W LOC DEV 1ST LESION IMAGE BX SPEC STEREO GUIDE*L*
8 of 11 series · 8 of 27 positions shown · non-contrast
Comparison: Previous exams.
COMPARISON: Previous exams.

Addendum:
CLINICAL DATA: Left breast asymmetry.

EXAM:
RIGHT BREAST STEREOTACTIC CORE NEEDLE BIOPSY

[L (1 of 6)]
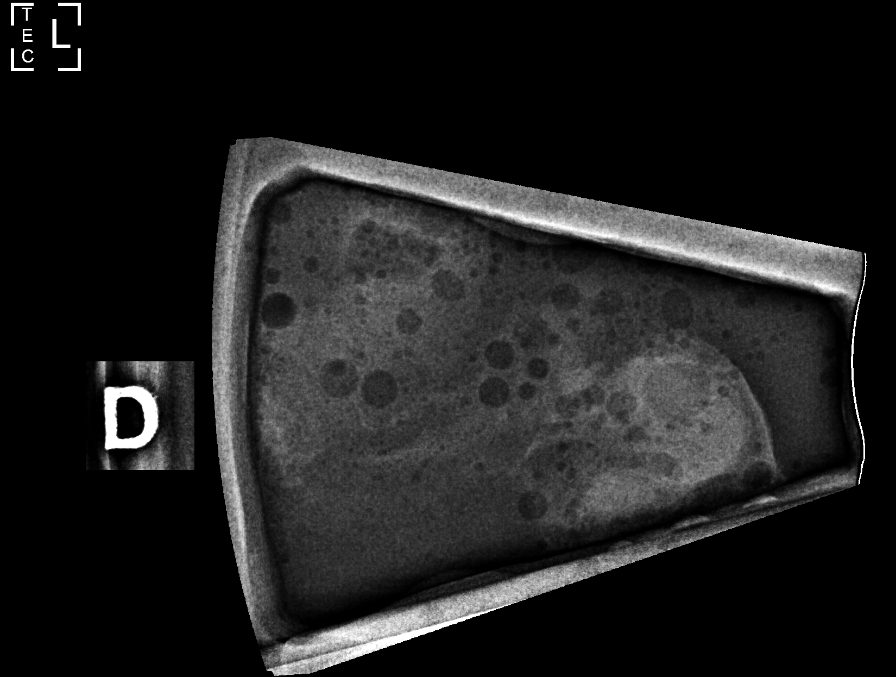

[L (2 of 6)]
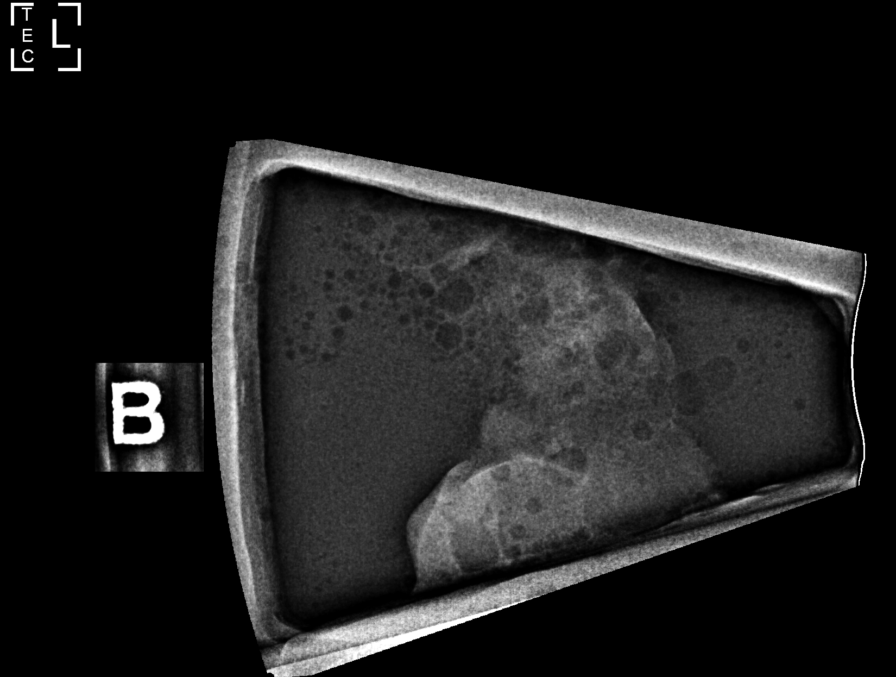

[L (3 of 6)]
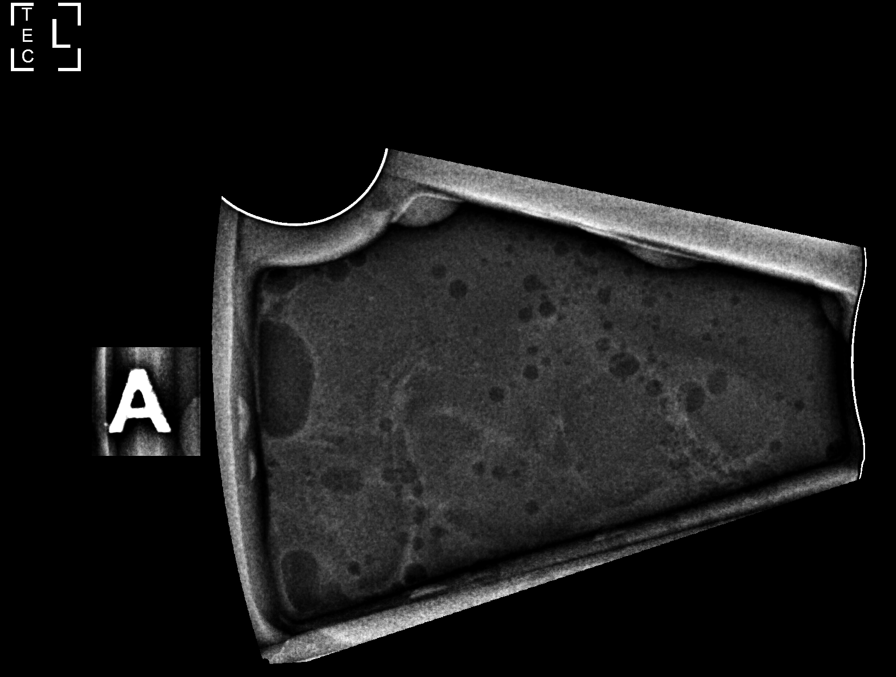

[L (4 of 6)]
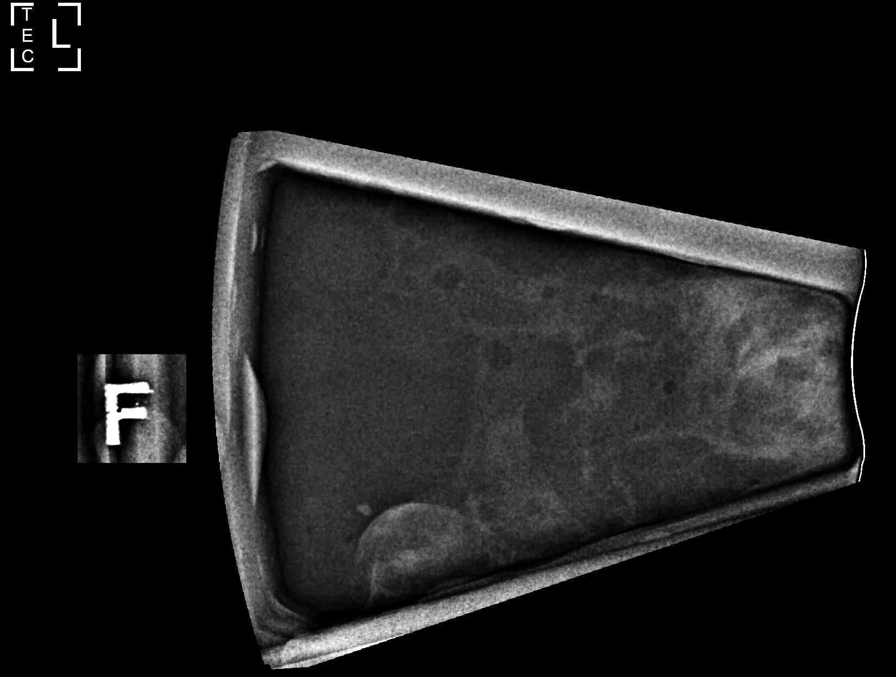

[L (5 of 6)]
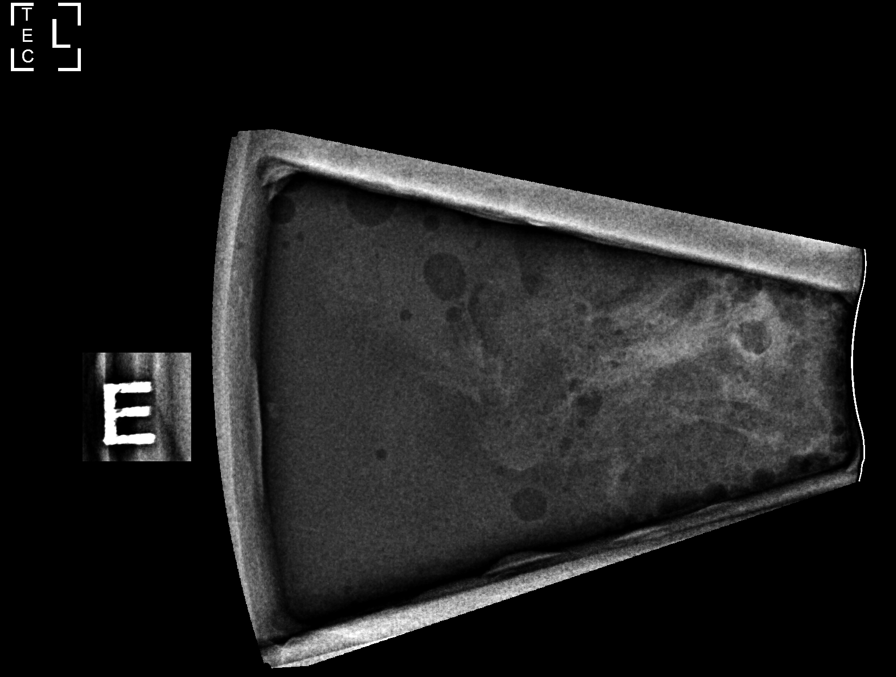

[L (6 of 6)]
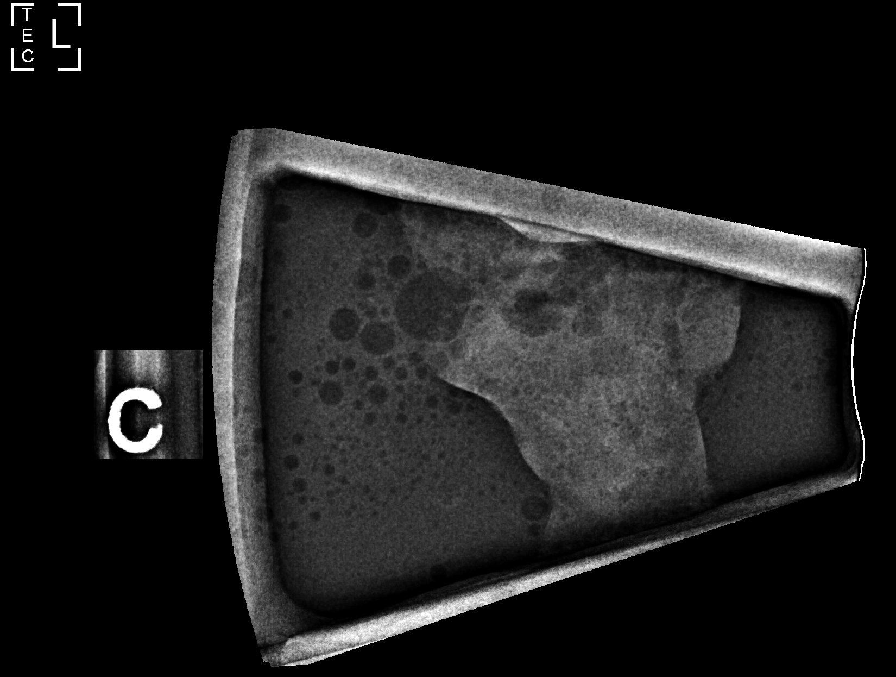

[L CC]
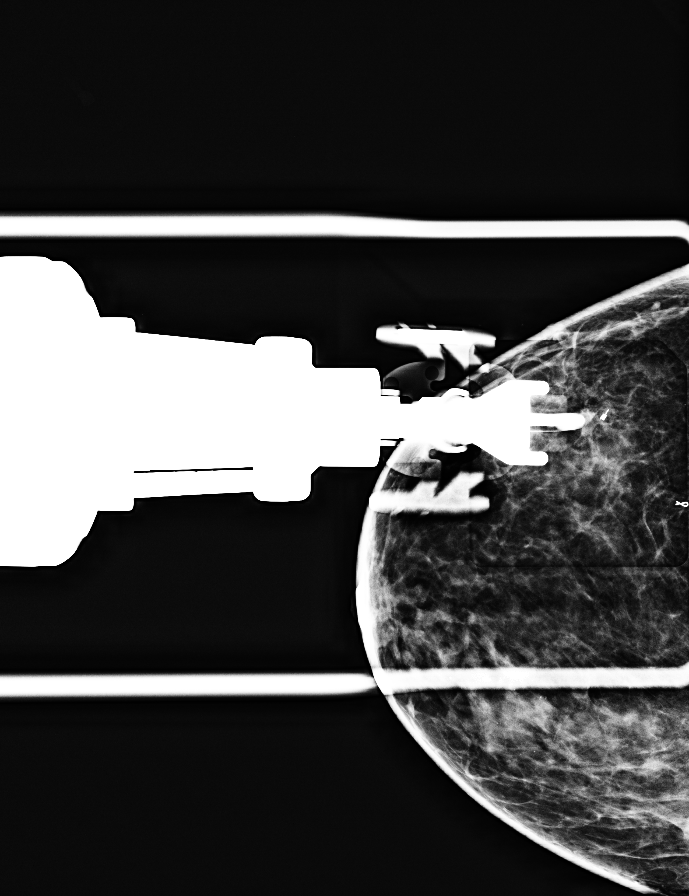

[L CC tomo · tomo slice 18/35.0]
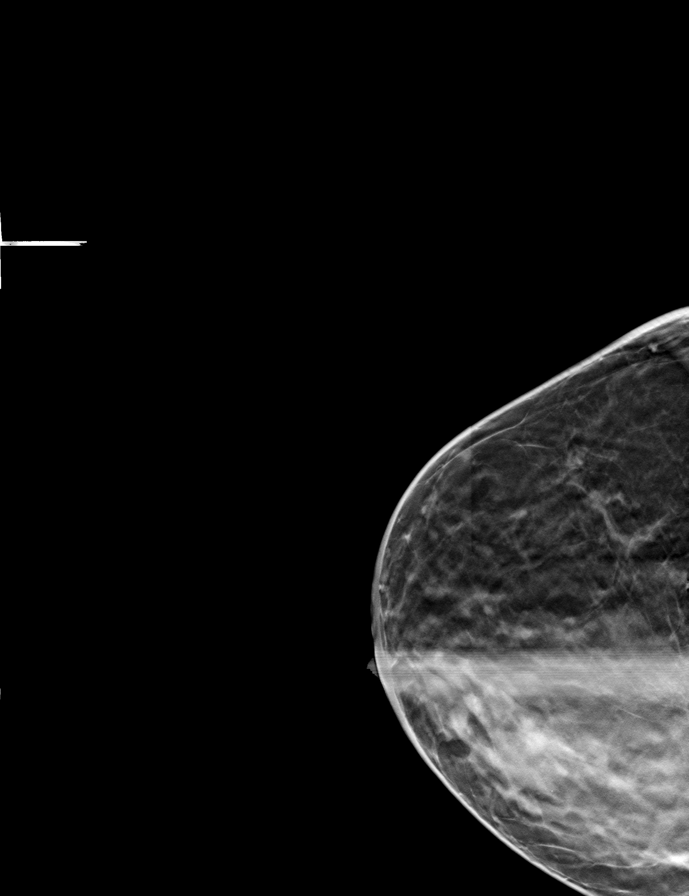

[8 of 27 positions shown; findings below may reference images not displayed]



Using sterile technique and 1% lidocaine as local anesthetic, under
stereotactic guidance, a 9 gauge vacuum assisted device was used to
perform core needle biopsy of asymmetry in the medial aspect of the
left breast using a superior to inferior approach.

Lesion quadrant: Upper inner quadrant

At the conclusion of the procedure, coil shaped tissue marker clip
was deployed into the biopsy cavity. Follow-up 2-view mammogram was
performed and dictated separately.
IMPRESSION: Stereotactic-guided biopsy of the left breast. No apparent
complications.

ADDENDUM:
Pathology revealed FIBROCYSTIC AND FIBROADENOMATOID CHANGE of the
Left breast, medial. This was found to be concordant by Dr. Che Wah
Naonobu.

Pathology results were discussed with the patient by telephone. The
patient reported doing well after the biopsy with tenderness at the
site. Post biopsy instructions and care were reviewed and questions
were answered. The patient was encouraged to call The [REDACTED]

The patient was instructed to return for Left diagnostic mammography
in 6 months and informed a reminder notice would be sent regarding
this appointment.

Pathology results reported by Rolaric Mongsonne, RN on 07/25/2020.



Using sterile technique and 1% lidocaine as local anesthetic, under
stereotactic guidance, a 9 gauge vacuum assisted device was used to
perform core needle biopsy of asymmetry in the medial aspect of the
left breast using a superior to inferior approach.

Lesion quadrant: Upper inner quadrant

At the conclusion of the procedure, coil shaped tissue marker clip
was deployed into the biopsy cavity. Follow-up 2-view mammogram was
performed and dictated separately.
IMPRESSION: Stereotactic-guided biopsy of the left breast. No apparent
complications.

## 2021-05-28 ENCOUNTER — Other Ambulatory Visit: Payer: Self-pay

## 2021-05-28 ENCOUNTER — Ambulatory Visit (INDEPENDENT_AMBULATORY_CARE_PROVIDER_SITE_OTHER): Payer: Self-pay | Admitting: Plastic Surgery

## 2021-05-28 DIAGNOSIS — Z411 Encounter for cosmetic surgery: Secondary | ICD-10-CM

## 2021-05-28 NOTE — Progress Notes (Signed)
Patient presents with aesthetic concerns regarding her face.  She is bothered by the static and dynamic lines in the forehead, glabella and crows feet.  She has had Botox in the past but cannot remember exactly how much.  She was overall happy with it last time but had to have additional treatment in the upper forehead and some along the crows feet.  On exam she has static and dynamic lines in all those areas.  We discussed the risks and benefits of Botox treatment which she agreed with and signed the consent form.  The forehead, glabella and crows feet were prepped with an alcohol pad and 108 units of Dysport were injected throughout those areas.  She tolerated this fine.  We will plan to see her again for touchup in a week or 2 in the event that she needs it otherwise we will see her at her next visit.  All of her questions were answered.

## 2021-07-10 ENCOUNTER — Other Ambulatory Visit: Payer: Self-pay

## 2021-07-10 ENCOUNTER — Ambulatory Visit
Admission: RE | Admit: 2021-07-10 | Discharge: 2021-07-10 | Disposition: A | Discharge status: Home / Self Care | Payer: No Typology Code available for payment source | Source: Ambulatory Visit | Attending: Family Medicine | Admitting: Family Medicine

## 2021-07-10 DIAGNOSIS — Z1231 Encounter for screening mammogram for malignant neoplasm of breast: Secondary | ICD-10-CM

## 2021-08-20 ENCOUNTER — Ambulatory Visit: Payer: No Typology Code available for payment source

## 2021-09-05 ENCOUNTER — Other Ambulatory Visit: Payer: No Typology Code available for payment source

## 2021-09-05 ENCOUNTER — Ambulatory Visit: Payer: No Typology Code available for payment source

## 2021-09-05 IMAGING — MG MM DIGITAL DIAGNOSTIC UNILAT*L* W/ TOMO W/ CAD
4 series · 4 of 12 positions shown · non-contrast
Comparison: Previous exam(s).

CLINICAL DATA: 49-year-old female for six-month follow-up following
biopsies of LEFT breast fibroadenomas.

EXAM:
DIGITAL DIAGNOSTIC UNILATERAL LEFT MAMMOGRAM WITH TOMOSYNTHESIS AND
CAD
TECHNIQUE: Left digital diagnostic mammography and breast tomosynthesis was
performed. The images were evaluated with computer-aided detection.

[L MLO synth-2D]
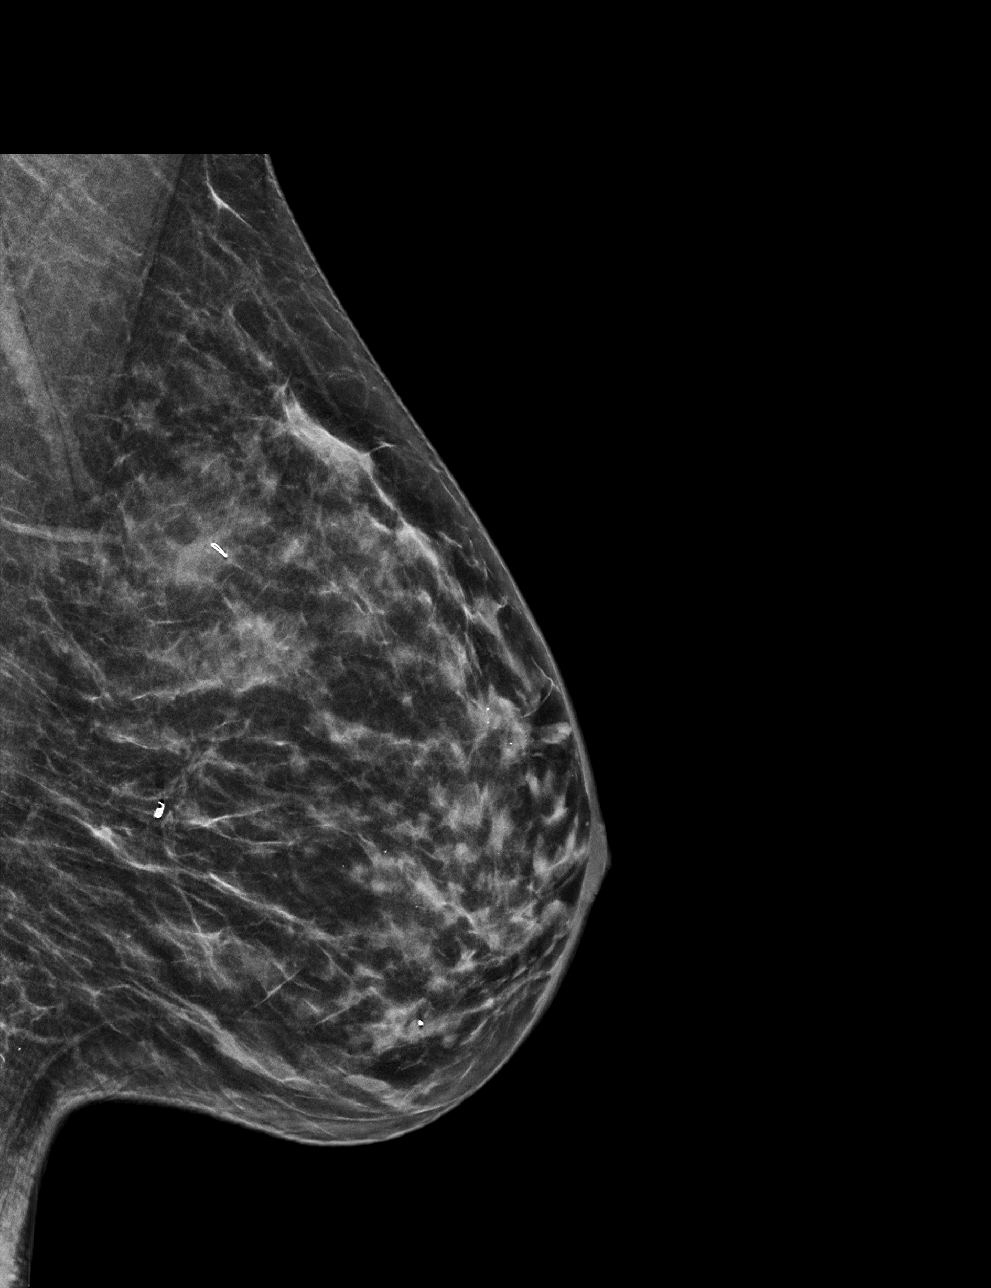

[L CC synth-2D]
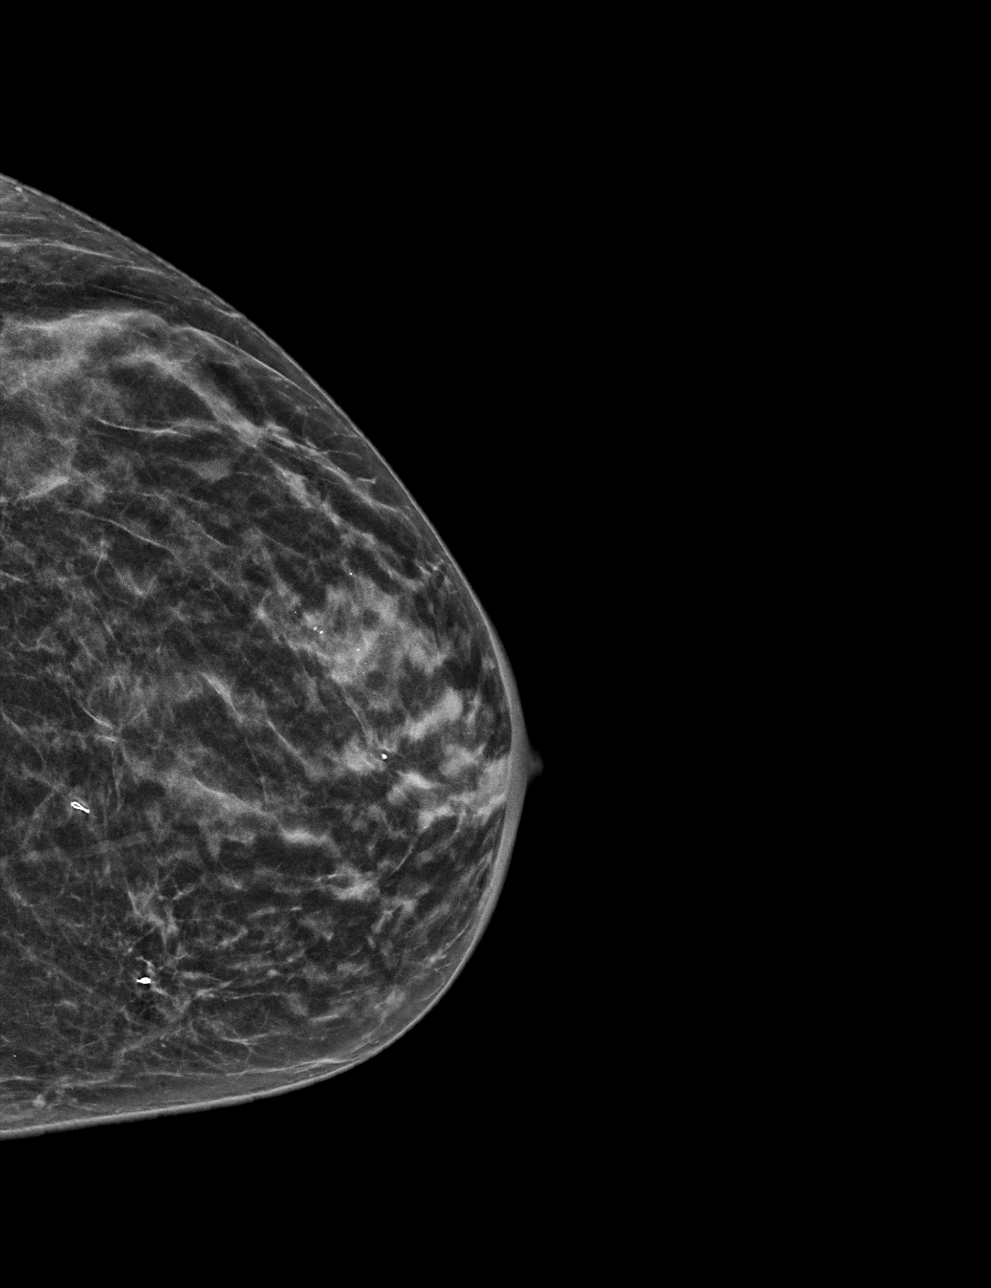

[L CC tomo · tomo slice 22/43.0]
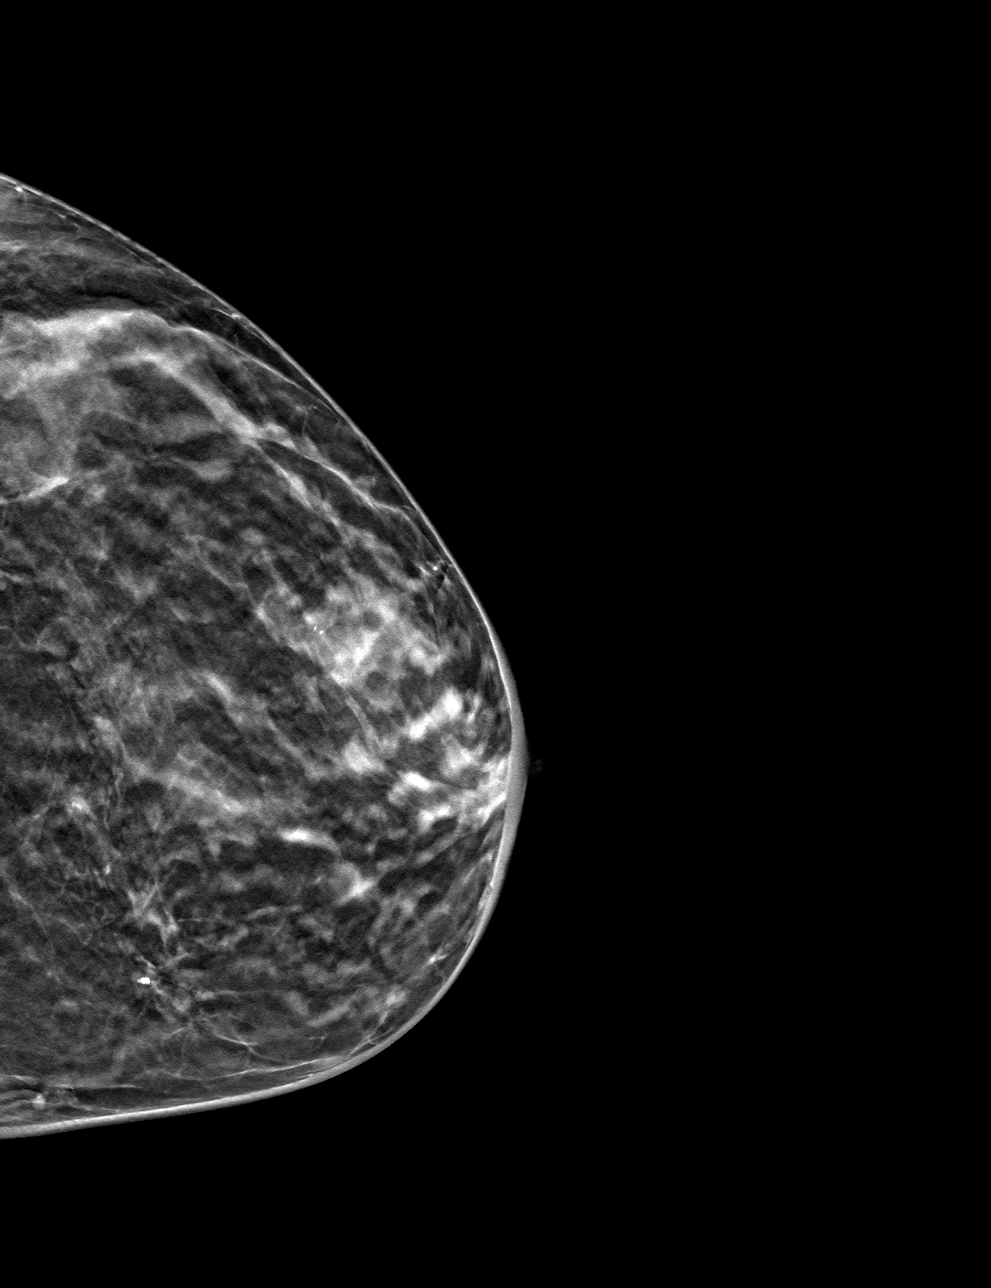

[L MLO tomo · tomo slice 25/48.0]
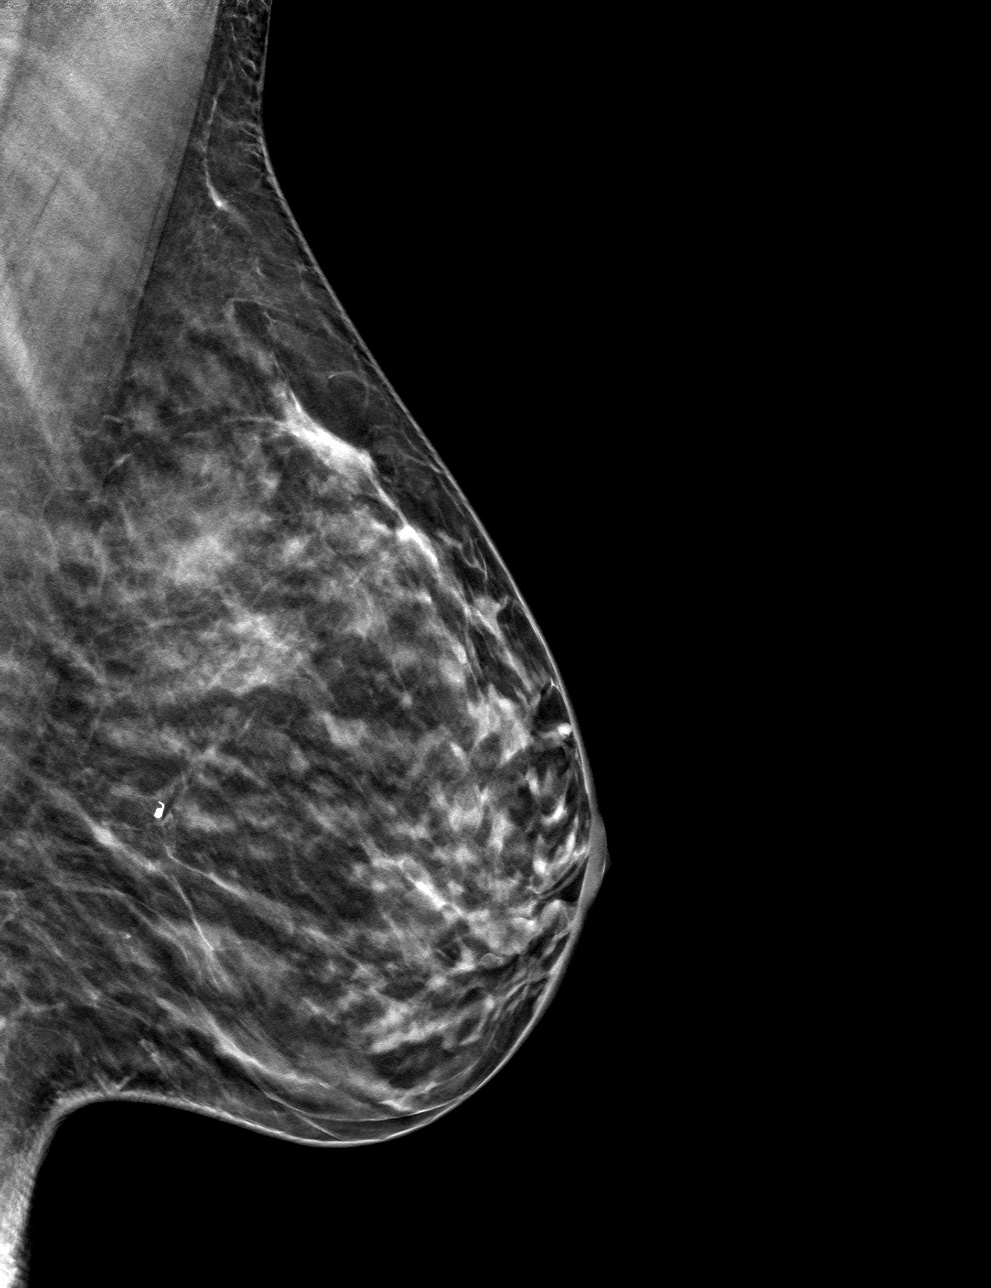

[4 of 12 positions shown; findings below may reference images not displayed]

ACR Breast Density Category c: The breast tissue is heterogeneously
dense, which may obscure small masses.
FINDINGS: 2D/3D full field views of the LEFT breast demonstrate a RIBBON
biopsy clip within the UPPER LEFT breast and a COIL biopsy clip
within the INNER LEFT breast.

No new or suspicious findings are noted within either breast.
IMPRESSION: No evidence of breast malignancy.

RECOMMENDATION:
Bilateral screening mammogram in June 2021 to resume annual
mammogram schedule.

I have discussed the findings and recommendations with the patient.
If applicable, a reminder letter will be sent to the patient
regarding the next appointment.

BI-RADS CATEGORY  2: Benign.

## 2021-10-30 ENCOUNTER — Other Ambulatory Visit: Payer: Self-pay | Admitting: Chiropractic Medicine

## 2021-10-30 DIAGNOSIS — S3992XA Unspecified injury of lower back, initial encounter: Secondary | ICD-10-CM

## 2021-12-04 ENCOUNTER — Other Ambulatory Visit: Payer: No Typology Code available for payment source

## 2021-12-16 ENCOUNTER — Other Ambulatory Visit: Payer: Self-pay

## 2021-12-16 ENCOUNTER — Ambulatory Visit
Admission: RE | Admit: 2021-12-16 | Discharge: 2021-12-16 | Disposition: A | Discharge status: Home / Self Care | Payer: No Typology Code available for payment source | Source: Ambulatory Visit | Attending: Chiropractic Medicine | Admitting: Chiropractic Medicine

## 2021-12-16 DIAGNOSIS — S3992XA Unspecified injury of lower back, initial encounter: Secondary | ICD-10-CM

## 2022-06-08 ENCOUNTER — Other Ambulatory Visit: Payer: Self-pay | Admitting: Obstetrics and Gynecology

## 2022-06-08 DIAGNOSIS — Z1231 Encounter for screening mammogram for malignant neoplasm of breast: Secondary | ICD-10-CM

## 2022-07-17 ENCOUNTER — Ambulatory Visit
Admission: RE | Admit: 2022-07-17 | Discharge: 2022-07-17 | Disposition: A | Discharge status: Home / Self Care | Payer: BC Managed Care – PPO | Source: Ambulatory Visit | Attending: Obstetrics and Gynecology | Admitting: Obstetrics and Gynecology

## 2022-07-17 ENCOUNTER — Ambulatory Visit: Payer: No Typology Code available for payment source

## 2022-07-17 DIAGNOSIS — Z1231 Encounter for screening mammogram for malignant neoplasm of breast: Secondary | ICD-10-CM

## 2022-07-31 DIAGNOSIS — E063 Autoimmune thyroiditis: Secondary | ICD-10-CM | POA: Diagnosis not present

## 2022-07-31 DIAGNOSIS — E349 Endocrine disorder, unspecified: Secondary | ICD-10-CM | POA: Diagnosis not present

## 2022-07-31 DIAGNOSIS — M3581 Multisystem inflammatory syndrome: Secondary | ICD-10-CM | POA: Diagnosis not present

## 2022-07-31 DIAGNOSIS — G9332 Myalgic encephalomyelitis/chronic fatigue syndrome: Secondary | ICD-10-CM | POA: Diagnosis not present

## 2022-08-04 DIAGNOSIS — L259 Unspecified contact dermatitis, unspecified cause: Secondary | ICD-10-CM | POA: Diagnosis not present

## 2022-08-04 DIAGNOSIS — D225 Melanocytic nevi of trunk: Secondary | ICD-10-CM | POA: Diagnosis not present

## 2022-08-04 DIAGNOSIS — Z1283 Encounter for screening for malignant neoplasm of skin: Secondary | ICD-10-CM | POA: Diagnosis not present

## 2022-08-13 DIAGNOSIS — M9903 Segmental and somatic dysfunction of lumbar region: Secondary | ICD-10-CM | POA: Diagnosis not present

## 2022-08-13 DIAGNOSIS — M5417 Radiculopathy, lumbosacral region: Secondary | ICD-10-CM | POA: Diagnosis not present

## 2022-08-13 DIAGNOSIS — M9905 Segmental and somatic dysfunction of pelvic region: Secondary | ICD-10-CM | POA: Diagnosis not present

## 2022-08-13 DIAGNOSIS — M9901 Segmental and somatic dysfunction of cervical region: Secondary | ICD-10-CM | POA: Diagnosis not present

## 2022-08-13 DIAGNOSIS — M5386 Other specified dorsopathies, lumbar region: Secondary | ICD-10-CM | POA: Diagnosis not present

## 2022-08-13 DIAGNOSIS — M5137 Other intervertebral disc degeneration, lumbosacral region: Secondary | ICD-10-CM | POA: Diagnosis not present

## 2022-08-19 DIAGNOSIS — M5137 Other intervertebral disc degeneration, lumbosacral region: Secondary | ICD-10-CM | POA: Diagnosis not present

## 2022-08-19 DIAGNOSIS — M9903 Segmental and somatic dysfunction of lumbar region: Secondary | ICD-10-CM | POA: Diagnosis not present

## 2022-08-19 DIAGNOSIS — M9905 Segmental and somatic dysfunction of pelvic region: Secondary | ICD-10-CM | POA: Diagnosis not present

## 2022-08-19 DIAGNOSIS — M5386 Other specified dorsopathies, lumbar region: Secondary | ICD-10-CM | POA: Diagnosis not present

## 2022-08-26 DIAGNOSIS — M9905 Segmental and somatic dysfunction of pelvic region: Secondary | ICD-10-CM | POA: Diagnosis not present

## 2022-08-26 DIAGNOSIS — M5137 Other intervertebral disc degeneration, lumbosacral region: Secondary | ICD-10-CM | POA: Diagnosis not present

## 2022-08-26 DIAGNOSIS — M9903 Segmental and somatic dysfunction of lumbar region: Secondary | ICD-10-CM | POA: Diagnosis not present

## 2022-08-26 DIAGNOSIS — M5386 Other specified dorsopathies, lumbar region: Secondary | ICD-10-CM | POA: Diagnosis not present

## 2022-09-04 DIAGNOSIS — M9901 Segmental and somatic dysfunction of cervical region: Secondary | ICD-10-CM | POA: Diagnosis not present

## 2022-09-04 DIAGNOSIS — M9903 Segmental and somatic dysfunction of lumbar region: Secondary | ICD-10-CM | POA: Diagnosis not present

## 2022-09-04 DIAGNOSIS — M9905 Segmental and somatic dysfunction of pelvic region: Secondary | ICD-10-CM | POA: Diagnosis not present

## 2022-09-04 DIAGNOSIS — M9902 Segmental and somatic dysfunction of thoracic region: Secondary | ICD-10-CM | POA: Diagnosis not present

## 2022-09-18 DIAGNOSIS — M3581 Multisystem inflammatory syndrome: Secondary | ICD-10-CM | POA: Diagnosis not present

## 2022-09-18 DIAGNOSIS — G9332 Myalgic encephalomyelitis/chronic fatigue syndrome: Secondary | ICD-10-CM | POA: Diagnosis not present

## 2022-09-18 DIAGNOSIS — E063 Autoimmune thyroiditis: Secondary | ICD-10-CM | POA: Diagnosis not present

## 2022-09-18 DIAGNOSIS — E349 Endocrine disorder, unspecified: Secondary | ICD-10-CM | POA: Diagnosis not present

## 2022-10-28 DIAGNOSIS — Z6825 Body mass index (BMI) 25.0-25.9, adult: Secondary | ICD-10-CM | POA: Diagnosis not present

## 2022-10-28 DIAGNOSIS — Z124 Encounter for screening for malignant neoplasm of cervix: Secondary | ICD-10-CM | POA: Diagnosis not present

## 2022-10-28 DIAGNOSIS — Z01419 Encounter for gynecological examination (general) (routine) without abnormal findings: Secondary | ICD-10-CM | POA: Diagnosis not present

## 2022-11-04 DIAGNOSIS — E063 Autoimmune thyroiditis: Secondary | ICD-10-CM | POA: Diagnosis not present

## 2022-11-04 DIAGNOSIS — E349 Endocrine disorder, unspecified: Secondary | ICD-10-CM | POA: Diagnosis not present

## 2022-11-04 DIAGNOSIS — G9332 Myalgic encephalomyelitis/chronic fatigue syndrome: Secondary | ICD-10-CM | POA: Diagnosis not present

## 2022-11-04 DIAGNOSIS — M3581 Multisystem inflammatory syndrome: Secondary | ICD-10-CM | POA: Diagnosis not present

## 2022-11-05 LAB — HM PAP SMEAR: HPV, high-risk: NEGATIVE

## 2022-11-23 DIAGNOSIS — L814 Other melanin hyperpigmentation: Secondary | ICD-10-CM | POA: Diagnosis not present

## 2022-11-23 DIAGNOSIS — L650 Telogen effluvium: Secondary | ICD-10-CM | POA: Diagnosis not present

## 2022-11-23 DIAGNOSIS — L578 Other skin changes due to chronic exposure to nonionizing radiation: Secondary | ICD-10-CM | POA: Diagnosis not present

## 2022-11-23 DIAGNOSIS — F32 Major depressive disorder, single episode: Secondary | ICD-10-CM | POA: Diagnosis not present

## 2022-12-28 DIAGNOSIS — F32A Depression, unspecified: Secondary | ICD-10-CM | POA: Diagnosis not present

## 2022-12-29 ENCOUNTER — Encounter: Payer: Self-pay | Admitting: Gastroenterology

## 2023-01-05 DIAGNOSIS — E033 Postinfectious hypothyroidism: Secondary | ICD-10-CM | POA: Diagnosis not present

## 2023-01-05 DIAGNOSIS — M3581 Multisystem inflammatory syndrome: Secondary | ICD-10-CM | POA: Diagnosis not present

## 2023-01-05 DIAGNOSIS — R229 Localized swelling, mass and lump, unspecified: Secondary | ICD-10-CM | POA: Diagnosis not present

## 2023-01-05 DIAGNOSIS — T753XXD Motion sickness, subsequent encounter: Secondary | ICD-10-CM | POA: Diagnosis not present

## 2023-01-05 DIAGNOSIS — E349 Endocrine disorder, unspecified: Secondary | ICD-10-CM | POA: Diagnosis not present

## 2023-01-05 DIAGNOSIS — G9332 Myalgic encephalomyelitis/chronic fatigue syndrome: Secondary | ICD-10-CM | POA: Diagnosis not present

## 2023-01-05 DIAGNOSIS — F40243 Fear of flying: Secondary | ICD-10-CM | POA: Diagnosis not present

## 2023-01-21 DIAGNOSIS — L282 Other prurigo: Secondary | ICD-10-CM | POA: Diagnosis not present

## 2023-01-22 DIAGNOSIS — Z713 Dietary counseling and surveillance: Secondary | ICD-10-CM | POA: Diagnosis not present

## 2023-01-22 DIAGNOSIS — E663 Overweight: Secondary | ICD-10-CM | POA: Diagnosis not present

## 2023-01-22 DIAGNOSIS — Z131 Encounter for screening for diabetes mellitus: Secondary | ICD-10-CM | POA: Diagnosis not present

## 2023-01-22 DIAGNOSIS — Z6829 Body mass index (BMI) 29.0-29.9, adult: Secondary | ICD-10-CM | POA: Diagnosis not present

## 2023-01-28 DIAGNOSIS — L209 Atopic dermatitis, unspecified: Secondary | ICD-10-CM | POA: Diagnosis not present

## 2023-01-28 DIAGNOSIS — L299 Pruritus, unspecified: Secondary | ICD-10-CM | POA: Diagnosis not present

## 2023-02-03 DIAGNOSIS — R3989 Other symptoms and signs involving the genitourinary system: Secondary | ICD-10-CM | POA: Diagnosis not present

## 2023-02-03 DIAGNOSIS — Z6828 Body mass index (BMI) 28.0-28.9, adult: Secondary | ICD-10-CM | POA: Diagnosis not present

## 2023-02-03 DIAGNOSIS — R399 Unspecified symptoms and signs involving the genitourinary system: Secondary | ICD-10-CM | POA: Diagnosis not present

## 2023-02-03 DIAGNOSIS — M2550 Pain in unspecified joint: Secondary | ICD-10-CM | POA: Diagnosis not present

## 2023-02-22 NOTE — Progress Notes (Signed)
SALVATORE POE    [id number]    Oct 19, 1971  Primary Care Physician:Kulik, Maryruth Hancock, MD  Referring Physician: Daisy Floro, MD [address] [place],  [place] [address]   Chief complaint:  Chief Complaint  Patient presents with   Colon Cancer Screening   Weight Gain   Bloated   change in bowel habits    Stools ar a little more sludgy    HPI: Traci Lopez is a 52 y.o. female presenting to clinic today for consult for screening colonoscopy at the request of Dr. Gildardo Cranker.  Today, she wants to establish care and to discuss a colonoscopy as she is overdue for one. She states she's had only one colonoscopy done several years ago.   She reports having a fatty liver and states she is no longer anemic. She reports having tarry stool. She states she has metabolic issues and regularly following with a doctor. She endorses having menstrual bleeding.   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  She reports having gallstone and bladderstone and is requesting a CT scan.   She denies diarrhea, constipation, nausea, blood in stool, black stool, vomiting, abdominal pain, reflux, dysphagia.  Per the blood work she showed [place] on her phone, her CBC and CMP and LFTs are normal. Her hemoglobin is 15.7   She states that she was married to a "narcissist" and states she has high cortisol levels due to that. She reports being adopted and not having any medical history.   GI Hx:  US Abdomen 02-04-12 1.  No gallstones.  No ductal dilatation.  2.  Diffuse fatty infiltration of the liver.  No focal abnormality.  3.  The tail of the pancreas is obscured by bowel gas.    Current Outpatient Medications:    ALPRAZolam (XANAX) 0.5 MG tablet, SMARTSIG:0.5 Tablet(s) By Mouth PRN, Disp: , Rfl:    buPROPion (WELLBUTRIN XL) 300 MG 24 hr tablet, Take 300  mg by mouth daily., Disp: , Rfl:    cetirizine (ZYRTEC) 10 MG tablet, Take 10 mg by mouth daily., Disp: , Rfl:    CRESTOR 5 MG tablet, Take 5 mg by mouth daily., Disp: , Rfl:    Ferrous Sulfate (IRON PO), Take by mouth daily., Disp: , Rfl:    Prasterone, DHEA, (DHEA PO), Take by mouth daily., Disp: , Rfl:    THYROID PO, Take by mouth daily. Thyroid Supplement, Disp: , Rfl:    tirzepatide (ZEPBOUND) 5 MG/0.5ML Pen, Inject 5 mg into the skin once a week., Disp: 2 mL, Rfl: 0   Tirzepatide-Weight Management (ZEPBOUND [place]), Inject into the skin once a week. Zepbound Compounded Version, Disp: , Rfl:    triamcinolone cream (KENALOG) 0.1 %, SMARTSIG:1 Application Topical 2-3 Times Daily, Disp: , Rfl:    UNABLE TO FIND, daily. Med Name: [employer] Supplements, Disp: , Rfl:    UNABLE TO FIND, 2 drops in the morning and at bedtime. Med Name: ESTROGEN/PROGESTERONE DROPS, Disp: , Rfl:    UNABLE TO FIND, daily. Med Name: LIVER SUPPORT SUPPLEMENTS, Disp: , Rfl:    VITAMIN D PO, Take by mouth daily., Disp: , Rfl:    Allergies as of 03/05/2023 - Review Complete 03/05/2023  Allergen Reaction Noted   Sulfites Diarrhea, Itching, and Swelling 09/06/2018    Past Medical History:  Diagnosis Date   Allergy    Anemia    Anxiety    Depression    Fatty liver disease, nonalcoholic    Hashimoto's disease    Hematochezia    Hyperlipidemia    Hypothyroidism    Iron deficiency    Kidney stones    Multiple pregnancy    g2 p2   OSA (obstructive sleep apnea)    on CPAP   Pyelonephritis    Sleep disorder    Vitamin D deficiency     Past Surgical History:  Procedure Laterality Date   BREAST BIOPSY Left 07/24/2020   SEPTOPLASTY  12/17/2008   WISDOM TOOTH EXTRACTION      Family History  Adopted: Yes  Problem Relation Age of Onset   Neuropathy Neg Hx    Seizures Neg Hx    Migraines Neg Hx    Breast cancer Neg Hx     Social History   Socioeconomic History   Marital status: Divorced    Spouse name:  Not on file   Number of children: 2   Years of education: BA   Highest education level: Not on file  Occupational History   Occupation: stay at home mom   Occupation: Art therapistpharmacutical rep    Comment: [employer]  Tobacco Use   Smoking status: Former    Packs/day: 1.00    Years: 10.00    Additional pack years: 0.00    Total pack years: 10.00    Types: Cigarettes    Quit date: 2003    Years since quitting: 21.3   Smokeless tobacco: Never  Vaping Use   Vaping Use: Never used  Substance and Sexual Activity   Alcohol use: Never   Drug use: Never   Sexual activity: Yes    Partners: Male    Birth control/protection: None  Other Topics Concern   Not on file  Social History Narrative      Caffeine use:  none   Regular exercise - 3x per week   Social Determinants of Health   Financial Resource Strain: Not on file  Food Insecurity: Not on file  Transportation Needs: Not on file  Physical Activity: Not on file  Stress: Not on file  Social Connections: Not on file  Intimate Partner Violence: Not on file      Review of systems: Review of Systems  Constitutional:  Positive for fatigue and unexpected weight change (40 lb gain).  Gastrointestinal:  Positive for abdominal distention (bloated). Negative for blood in stool, nausea and vomiting.  Skin:  Positive for rash.      Physical Exam: Vitals:   03/05/23 0855  BP: 90/70  Pulse: 88   Body mass index is 27.04 kg/m.  General: well-appearing   Eyes: sclera anicteric, no redness ENT: oral mucosa moist without lesions, no cervical or supraclavicular lymphadenopathy CV: RRR, no JVD, no peripheral edema Resp: clear to auscultation bilaterally, normal RR and effort noted GI: soft, no tenderness, with active bowel sounds. No guarding or palpable organomegaly noted. Skin; warm and dry, no rash or jaundice noted Neuro: awake, alert and oriented x 3. Normal gross motor function and fluent speech   Data Reviewed:  Reviewed labs,  radiology imaging, old records and pertinent past GI work up   Assessment and Plan/Recommendations:  -Schedule a colonoscopy  - US abdomen  -Zofran   This visit required *** minutes of patient care (this includes precharting, chart review, review of results, face-to-face time used for counseling as well as treatment plan  and follow-up. The patient was provided an opportunity to ask questions and all were answered. The patient agreed with the plan and demonstrated an understanding of the instructions.  Iona Beard , MD  CC: Daisy Floro, MD   [facility] [employer] as a scribe for Marsa Aris, MD.,have documented all relevant documentation on the behalf of Marsa Aris, MD,as directed by  Marsa Aris, MD while in the presence of Marsa Aris, MD.   I, Marsa Aris, MD, have reviewed all documentation for this visit. The documentation on 03/05/23 for the exam, diagnosis, procedures, and orders are all accurate and complete.

## 2023-03-02 ENCOUNTER — Encounter: Payer: Self-pay | Admitting: Family Medicine

## 2023-03-02 ENCOUNTER — Ambulatory Visit: Payer: BC Managed Care – PPO | Admitting: Family Medicine

## 2023-03-02 VITALS — BP 112/82 | HR 82 | Temp 98.3°F | Ht 66.0 in | Wt 166.0 lb

## 2023-03-02 DIAGNOSIS — E7800 Pure hypercholesterolemia, unspecified: Secondary | ICD-10-CM | POA: Diagnosis not present

## 2023-03-02 DIAGNOSIS — Z6826 Body mass index (BMI) 26.0-26.9, adult: Secondary | ICD-10-CM

## 2023-03-02 DIAGNOSIS — E663 Overweight: Secondary | ICD-10-CM

## 2023-03-02 DIAGNOSIS — E039 Hypothyroidism, unspecified: Secondary | ICD-10-CM | POA: Diagnosis not present

## 2023-03-02 DIAGNOSIS — K760 Fatty (change of) liver, not elsewhere classified: Secondary | ICD-10-CM | POA: Diagnosis not present

## 2023-03-02 MED ORDER — ZEPBOUND 5 MG/0.5ML ~~LOC~~ SOAJ: 5.0000 mg | SUBCUTANEOUS | 0 refills | Status: AC

## 2023-03-02 NOTE — Progress Notes (Signed)
New Patient Office Visit  Subjective:  Patient ID: Traci Lopez, female    DOB: [birth year]  Age: 52 y.o. MRN: [id number]  CC:  Chief Complaint  Patient presents with   Establish Care    Initial visit to establish care to establish with new pcp     HPI Traci Lopez presents for new patient to establish-Dr. Tenny Craw Functional medication(s) doctor-diagnosis Hashimoto's 2012-had gained 35#. Levels all over.  In 2020-Optiva and lost 62#-felt great.  Aug 2022-got Covid and downhill.  Thyroid went hyper.  Tired all the time, brain fog, forgetful. 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  Told to start Ozempic.  Patient wants to try zepbound-has fatty liver and hasimoto's.  Getting compounded zepbound. On starting dose for 2 weeks. No SE.  Walks daily and lifts weights.  Healthy diet.  Just stuck and concerned about health  Also has Nonalcoholic Fatty Liver Disease (NAFLD)-needs to keep weight down.    Past Medical History:  Diagnosis Date   Allergy    Anemia    Anxiety    Depression    Fatty liver disease, nonalcoholic    Hashimoto's disease    Hematochezia    Hyperlipidemia    Hypothyroidism    Iron deficiency    Kidney stones    Multiple pregnancy    g2 p2   OSA (obstructive sleep apnea)    on CPAP   Pyelonephritis    Sleep apnea    Sleep disorder    Vitamin D deficiency     Past Surgical History:  Procedure Laterality Date   BREAST BIOPSY Left 07/24/2020   SEPTOPLASTY  12/17/2008   WISDOM TOOTH EXTRACTION      Family History  Adopted: Yes  Problem Relation Age of Onset   Neuropathy Neg Hx    Seizures Neg Hx    Migraines Neg Hx    Breast cancer Neg Hx     Social History   Socioeconomic History   Marital status: Divorced    Spouse name: Not on file   Number of children: 2   Years of education: BA   Highest education level: Not on file  Occupational History   Occupation: stay at home mom   Occupation: Art therapist rep     Comment: [employer]  Tobacco Use   Smoking status: Former    Packs/day: 1.00    Years: 10.00    Additional pack years: 0.00    Total pack years: 10.00    Types: Cigarettes    Quit date: 2003    Years since quitting: 21.3   Smokeless tobacco: Never  Vaping Use   Vaping Use: Never used  Substance and Sexual Activity   Alcohol use: Never   Drug use: Never   Sexual activity: Yes    Partners: Male    Birth control/protection: None  Other Topics Concern   Not on file  Social History Narrative      Caffeine use:  none   Regular exercise - 3x per week   Social Determinants of Health   Financial Resource Strain: Not on file  Food Insecurity: Not on file  Transportation Needs: Not on file  Physical Activity: Not on file  Stress: Not on file  Social Connections: Not on file  Intimate Partner Violence: Not on file    ROS  ROS: Gen: no fever, chills  Skin: eczema recently-started on left leg and then all over-scattered bumps.  Taking supps as well from functional medicine doctor  ENT: no ear pain, ear drainage, nasal congestion, rhinorrhea, sinus pressure, sore throat Eyes: no blurry vision, double vision Resp: no cough, wheeze,SOB CV: no CP, palpitations, LE edema,  GI: no heartburn, n/v/d/c, abd pain GU: no dysuria, urgency, frequency, hematuria MSK: no joint pain, myalgias, back pain Neuro: no dizziness, headache, weakness, vertigo Psych: no depression, anxiety, insomnia, SI   Objective:   Today's Vitals: BP 112/82   Pulse 82   Temp 98.3 F (36.8 C) (Temporal)   Ht  (1.676 m)   Wt 166 lb (75.3 kg)   LMP 02/24/2023 (Approximate)   SpO2 98%   BMI 26.79 kg/m   Physical Exam  Gen: WDWN NAD wf HEENT: NCAT, conjunctiva not injected, sclera nonicteric NECK:  supple, no thyromegaly, no nodes, no carotid bruits CARDIAC: RRR, S1S2+, no murmur. DP 2+B LUNGS: CTAB. No wheezes ABDOMEN:  BS+, soft, NTND, No HSM, no masses EXT:  no edema MSK: no gross abnormalities.   NEURO: A&O x3.  CN II-XII intact.  PSYCH: normal mood. Good eye contact   01/22/23 Hemoglobin A1c 5.2  Assessment & Plan:   Problem List Items Addressed This Visit       Digestive   NAFLD (nonalcoholic fatty liver disease)     Endocrine   Hypothyroidism   Relevant Medications   THYROID PO     Other   Hyperlipidemia - Primary   Other Visit Diagnoses     Overweight with body mass index (BMI) of 26 to 26.9 in adult         1.  Hypothyroidism due to Hashimoto's-did well until she got COVID, then went hyper.  Currently on alternative medicines.  Managed by functional medicine. 2.  Hyperlipidemia-chronic.  Need to get copy of labs. 3.  Nonalcoholic Fatty Liver Disease (NAFLD)-chronic.  Working on diet/exercise.  Weight has been difficult to control.  She is currently taking compounded Zepbound-would like to take prescription.  I agree as she has multiple other risk factors as well. 4.  Overweight with comorbidity of hypothyroidism, hyperlipidemia, nonalcoholic fatty liver disease.  Really needs to get to a BMI less than 25, and maintain it.  She is already working hard on diet/exercise but not attaining goal.  Hopefully, Zepp bound will be approved.  Will prescribe 5 mg weekly and titrate monthly.  Follow-up in 3 months  Outpatient Encounter Medications as of 03/02/2023  Medication Sig   ALPRAZolam (XANAX) 0.5 MG tablet SMARTSIG:0.5 Tablet(s) By Mouth PRN   buPROPion (WELLBUTRIN XL) 300 MG 24 hr tablet Take 300 mg by mouth daily.   cetirizine (ZYRTEC) 10 MG tablet Take 10 mg by mouth daily.   CRESTOR 5 MG tablet Take 5 mg by mouth daily.   Ferrous Sulfate (IRON PO) Take by mouth daily.   Prasterone, DHEA, (DHEA PO) Take by mouth daily.   THYROID PO Take by mouth daily. Thyroid Supplement   tirzepatide (ZEPBOUND) 5 MG/0.5ML Pen Inject 5 mg into the skin once a week.   Tirzepatide-Weight Management (ZEPBOUND [place]) Inject into the skin once a week. Zepbound Compounded Version    triamcinolone cream (KENALOG) 0.1 % SMARTSIG:1 Application Topical 2-3 Times Daily   UNABLE TO FIND daily. Med Name: [employer] Supplements   UNABLE TO FIND 2 drops in the morning and at bedtime. Med Name: ESTROGEN/PROGESTERONE DROPS   UNABLE TO FIND daily. Med Name: LIVER SUPPORT SUPPLEMENTS   VITAMIN D PO Take by mouth daily.   [DISCONTINUED] ALPRAZolam (XANAX) 0.25 MG tablet Take 0.125-0.25 mg by mouth  daily.   [DISCONTINUED] LOW-OGESTREL 0.3-30 MG-MCG tablet Take 1 tablet by mouth daily.   [DISCONTINUED] metaxalone (SKELAXIN) 800 MG tablet Take 800 mg by mouth 3 (three) times daily as needed (tight muscles).   [DISCONTINUED] SUPREP BOWEL PREP KIT 17.5-3.13-1.6 GM/177ML SOLN MIX AND DRINK AS DIRECTED   [DISCONTINUED] SYNTHROID 200 MCG tablet Take 200 mcg by mouth daily.   No facility-administered encounter medications on file as of 03/02/2023.    Follow-up: Return in about 3 months (around 06/01/2023) for wt.   Angelena Sole, MD

## 2023-03-02 NOTE — Patient Instructions (Addendum)
Welcome to [facility]! It was a pleasure meeting you today.  As discussed, Please schedule a 3 month follow up visit today.  Colace 100-200 mg daily, miralax daily.  Massage tummy  Call monthly for dose of zepbound-increase or stay same  PLEASE NOTE:  If you had any LAB tests please let us know if you have not heard back within a few days. You may see your results on MyChart before we have a chance to review them but we will give you a call once they are reviewed by [place]. If we ordered any REFERRALS today, please let us know if you have not heard from their office within the next week.  Let us know through MyChart if you are needing REFILLS, or have your pharmacy send [place] the request. You can also use MyChart to communicate with me or any office staff.  Please try these tips to maintain a healthy lifestyle:  Eat most of your calories during the day when you are active. Eliminate processed foods including packaged sweets (pies, cakes, cookies), reduce intake of potatoes, white bread, white pasta, and white rice. Look for whole grain options, oat flour or almond flour.  Each meal should contain half fruits/vegetables, one quarter protein, and one quarter carbs (no bigger than a computer mouse).  Cut down on sweet beverages. This includes juice, soda, and sweet tea. Also watch fruit intake, though this is a healthier sweet option, it still contains natural sugar! Limit to 3 servings daily.  Drink at least 1 glass of water with each meal and aim for at least 8 glasses per day  Exercise at least 150 minutes every week.

## 2023-03-05 ENCOUNTER — Encounter: Payer: Self-pay | Admitting: Gastroenterology

## 2023-03-05 ENCOUNTER — Ambulatory Visit: Payer: BC Managed Care – PPO | Admitting: Gastroenterology

## 2023-03-05 VITALS — BP 90/70 | HR 88 | Ht 65.5 in | Wt 165.0 lb

## 2023-03-05 DIAGNOSIS — K760 Fatty (change of) liver, not elsewhere classified: Secondary | ICD-10-CM | POA: Diagnosis not present

## 2023-03-05 DIAGNOSIS — N200 Calculus of kidney: Secondary | ICD-10-CM | POA: Diagnosis not present

## 2023-03-05 DIAGNOSIS — K8020 Calculus of gallbladder without cholecystitis without obstruction: Secondary | ICD-10-CM

## 2023-03-05 DIAGNOSIS — Z1211 Encounter for screening for malignant neoplasm of colon: Secondary | ICD-10-CM

## 2023-03-05 MED ORDER — PLENVU 140 G PO SOLR: 1.0000 | Freq: Once | ORAL | 0 refills | Status: AC

## 2023-03-05 NOTE — Patient Instructions (Addendum)
You have been scheduled for a colonoscopy. Please follow written instructions given to you at your visit today.  Please pick up your prep supplies at the pharmacy within the next 1-3 days. If you use inhalers (even only as needed), please bring them with you on the day of your procedure.   You have been scheduled for an abdominal ultrasound at [facility] (1st floor of hospital) on 03/12/2023 at 11:00 pm. Please arrive 30 minutes prior to your appointment for registration. Make certain not to have anything to eat or drink after midnight prior to your appointment. Should you need to reschedule your appointment, please contact radiology at [phone]. This test typically takes about 30 minutes to perform.               __  __ GLP 1 AGONIST Weekly injectables                   ([facility] [place], [place], [place], [place], [place], [place])    Hold for 7 days prior to the procedure   Due to recent changes in healthcare laws, you may see the results of your imaging and laboratory studies on MyChart before your provider has had a chance to review them.  We understand that in some cases there may be results that are confusing or concerning to you. Not all laboratory results come back in the same time frame and the provider may be waiting for multiple results in order to interpret others.  Please give [place] 48 hours in order for your provider to thoroughly review all the results before contacting the office for clarification of your results.    I appreciate the  opportunity to care for you  Thank You   Marsa Aris , MD

## 2023-03-09 ENCOUNTER — Encounter: Payer: Self-pay | Admitting: Gastroenterology

## 2023-03-09 ENCOUNTER — Ambulatory Visit: Payer: BC Managed Care – PPO | Admitting: Family Medicine

## 2023-03-12 ENCOUNTER — Ambulatory Visit (HOSPITAL_COMMUNITY)
Admission: RE | Admit: 2023-03-12 | Discharge: 2023-03-12 | Disposition: A | Discharge status: Home / Self Care | Payer: BC Managed Care – PPO | Source: Ambulatory Visit | Attending: Gastroenterology | Admitting: Gastroenterology

## 2023-03-12 DIAGNOSIS — N200 Calculus of kidney: Secondary | ICD-10-CM | POA: Diagnosis not present

## 2023-03-12 DIAGNOSIS — K8020 Calculus of gallbladder without cholecystitis without obstruction: Secondary | ICD-10-CM | POA: Insufficient documentation

## 2023-03-12 DIAGNOSIS — Z1211 Encounter for screening for malignant neoplasm of colon: Secondary | ICD-10-CM | POA: Diagnosis not present

## 2023-03-12 DIAGNOSIS — K760 Fatty (change of) liver, not elsewhere classified: Secondary | ICD-10-CM

## 2023-03-24 DIAGNOSIS — E349 Endocrine disorder, unspecified: Secondary | ICD-10-CM | POA: Diagnosis not present

## 2023-03-24 DIAGNOSIS — M3581 Multisystem inflammatory syndrome: Secondary | ICD-10-CM | POA: Diagnosis not present

## 2023-03-24 DIAGNOSIS — E063 Autoimmune thyroiditis: Secondary | ICD-10-CM | POA: Diagnosis not present

## 2023-03-24 DIAGNOSIS — G9332 Myalgic encephalomyelitis/chronic fatigue syndrome: Secondary | ICD-10-CM | POA: Diagnosis not present

## 2023-04-26 DIAGNOSIS — F32A Depression, unspecified: Secondary | ICD-10-CM | POA: Diagnosis not present

## 2023-05-07 ENCOUNTER — Encounter: Payer: BC Managed Care – PPO | Admitting: Gastroenterology

## 2023-05-12 DIAGNOSIS — R7989 Other specified abnormal findings of blood chemistry: Secondary | ICD-10-CM | POA: Diagnosis not present

## 2023-05-12 DIAGNOSIS — Z8616 Personal history of COVID-19: Secondary | ICD-10-CM | POA: Diagnosis not present

## 2023-05-12 DIAGNOSIS — E630 Essential fatty acid [EFA] deficiency: Secondary | ICD-10-CM | POA: Diagnosis not present

## 2023-05-12 DIAGNOSIS — E063 Autoimmune thyroiditis: Secondary | ICD-10-CM | POA: Diagnosis not present

## 2023-05-24 DIAGNOSIS — R748 Abnormal levels of other serum enzymes: Secondary | ICD-10-CM | POA: Diagnosis not present

## 2023-05-24 DIAGNOSIS — R7989 Other specified abnormal findings of blood chemistry: Secondary | ICD-10-CM | POA: Diagnosis not present

## 2023-05-24 DIAGNOSIS — E063 Autoimmune thyroiditis: Secondary | ICD-10-CM | POA: Diagnosis not present

## 2023-05-24 DIAGNOSIS — E630 Essential fatty acid [EFA] deficiency: Secondary | ICD-10-CM | POA: Diagnosis not present

## 2023-05-24 DIAGNOSIS — Z8616 Personal history of COVID-19: Secondary | ICD-10-CM | POA: Diagnosis not present

## 2023-05-24 DIAGNOSIS — E569 Vitamin deficiency, unspecified: Secondary | ICD-10-CM | POA: Diagnosis not present

## 2023-05-24 DIAGNOSIS — Z130 Encounter for screening for diseases of the blood and blood-forming organs and certain disorders involving the immune mechanism: Secondary | ICD-10-CM | POA: Diagnosis not present

## 2023-06-01 ENCOUNTER — Ambulatory Visit: Payer: BC Managed Care – PPO | Admitting: Family Medicine

## 2023-06-08 ENCOUNTER — Encounter: Payer: BC Managed Care – PPO | Admitting: Gastroenterology

## 2023-06-15 ENCOUNTER — Other Ambulatory Visit: Payer: Self-pay | Admitting: Obstetrics and Gynecology

## 2023-06-15 DIAGNOSIS — Z1231 Encounter for screening mammogram for malignant neoplasm of breast: Secondary | ICD-10-CM

## 2023-06-30 DIAGNOSIS — Z8616 Personal history of COVID-19: Secondary | ICD-10-CM | POA: Diagnosis not present

## 2023-06-30 DIAGNOSIS — E630 Essential fatty acid [EFA] deficiency: Secondary | ICD-10-CM | POA: Diagnosis not present

## 2023-06-30 DIAGNOSIS — E063 Autoimmune thyroiditis: Secondary | ICD-10-CM | POA: Diagnosis not present

## 2023-06-30 DIAGNOSIS — R7989 Other specified abnormal findings of blood chemistry: Secondary | ICD-10-CM | POA: Diagnosis not present

## 2023-07-29 DIAGNOSIS — L719 Rosacea, unspecified: Secondary | ICD-10-CM | POA: Diagnosis not present

## 2023-07-29 DIAGNOSIS — L300 Nummular dermatitis: Secondary | ICD-10-CM | POA: Diagnosis not present

## 2023-07-29 DIAGNOSIS — L570 Actinic keratosis: Secondary | ICD-10-CM | POA: Diagnosis not present

## 2023-07-29 DIAGNOSIS — L820 Inflamed seborrheic keratosis: Secondary | ICD-10-CM | POA: Diagnosis not present

## 2023-07-29 DIAGNOSIS — L814 Other melanin hyperpigmentation: Secondary | ICD-10-CM | POA: Diagnosis not present

## 2023-07-29 DIAGNOSIS — L578 Other skin changes due to chronic exposure to nonionizing radiation: Secondary | ICD-10-CM | POA: Diagnosis not present

## 2023-07-30 ENCOUNTER — Ambulatory Visit: Payer: BC Managed Care – PPO

## 2023-08-03 ENCOUNTER — Ambulatory Visit
Admission: RE | Admit: 2023-08-03 | Discharge: 2023-08-03 | Disposition: A | Discharge status: Home / Self Care | Payer: BC Managed Care – PPO | Source: Ambulatory Visit | Attending: Obstetrics and Gynecology | Admitting: Obstetrics and Gynecology

## 2023-08-03 DIAGNOSIS — Z1231 Encounter for screening mammogram for malignant neoplasm of breast: Secondary | ICD-10-CM | POA: Diagnosis not present

## 2023-08-04 ENCOUNTER — Encounter: Payer: BC Managed Care – PPO | Admitting: Gastroenterology

## 2023-08-10 DIAGNOSIS — K5900 Constipation, unspecified: Secondary | ICD-10-CM | POA: Diagnosis not present

## 2023-08-10 DIAGNOSIS — B9681 Helicobacter pylori [H. pylori] as the cause of diseases classified elsewhere: Secondary | ICD-10-CM | POA: Diagnosis not present

## 2023-08-10 DIAGNOSIS — Z1211 Encounter for screening for malignant neoplasm of colon: Secondary | ICD-10-CM | POA: Diagnosis not present

## 2023-08-10 DIAGNOSIS — E039 Hypothyroidism, unspecified: Secondary | ICD-10-CM | POA: Diagnosis not present

## 2023-08-16 DIAGNOSIS — F32A Depression, unspecified: Secondary | ICD-10-CM | POA: Diagnosis not present

## 2023-08-20 ENCOUNTER — Other Ambulatory Visit: Payer: Self-pay | Admitting: Family Medicine

## 2023-08-20 DIAGNOSIS — Z1211 Encounter for screening for malignant neoplasm of colon: Secondary | ICD-10-CM

## 2023-08-20 DIAGNOSIS — Z1212 Encounter for screening for malignant neoplasm of rectum: Secondary | ICD-10-CM

## 2023-09-24 DIAGNOSIS — Z860102 Personal history of hyperplastic colon polyps: Secondary | ICD-10-CM | POA: Diagnosis not present

## 2023-09-24 DIAGNOSIS — Z09 Encounter for follow-up examination after completed treatment for conditions other than malignant neoplasm: Secondary | ICD-10-CM | POA: Diagnosis not present

## 2023-09-24 DIAGNOSIS — Z1211 Encounter for screening for malignant neoplasm of colon: Secondary | ICD-10-CM | POA: Diagnosis not present

## 2023-09-30 DIAGNOSIS — L820 Inflamed seborrheic keratosis: Secondary | ICD-10-CM | POA: Diagnosis not present

## 2023-10-13 DIAGNOSIS — R718 Other abnormality of red blood cells: Secondary | ICD-10-CM | POA: Diagnosis not present

## 2023-10-13 DIAGNOSIS — B9681 Helicobacter pylori [H. pylori] as the cause of diseases classified elsewhere: Secondary | ICD-10-CM | POA: Diagnosis not present

## 2023-10-13 DIAGNOSIS — E063 Autoimmune thyroiditis: Secondary | ICD-10-CM | POA: Diagnosis not present

## 2023-10-13 DIAGNOSIS — E630 Essential fatty acid [EFA] deficiency: Secondary | ICD-10-CM | POA: Diagnosis not present

## 2023-11-02 DIAGNOSIS — E630 Essential fatty acid [EFA] deficiency: Secondary | ICD-10-CM | POA: Diagnosis not present

## 2023-11-02 DIAGNOSIS — E063 Autoimmune thyroiditis: Secondary | ICD-10-CM | POA: Diagnosis not present

## 2023-11-02 DIAGNOSIS — R7989 Other specified abnormal findings of blood chemistry: Secondary | ICD-10-CM | POA: Diagnosis not present

## 2023-11-02 DIAGNOSIS — Z8616 Personal history of COVID-19: Secondary | ICD-10-CM | POA: Diagnosis not present

## 2023-11-04 ENCOUNTER — Other Ambulatory Visit (HOSPITAL_COMMUNITY): Payer: Self-pay | Admitting: Nurse Practitioner

## 2023-11-04 DIAGNOSIS — Z79890 Hormone replacement therapy (postmenopausal): Secondary | ICD-10-CM

## 2023-12-08 DIAGNOSIS — Z13220 Encounter for screening for lipoid disorders: Secondary | ICD-10-CM | POA: Diagnosis not present

## 2023-12-08 DIAGNOSIS — E063 Autoimmune thyroiditis: Secondary | ICD-10-CM | POA: Diagnosis not present

## 2023-12-08 DIAGNOSIS — Z79890 Hormone replacement therapy (postmenopausal): Secondary | ICD-10-CM | POA: Diagnosis not present

## 2023-12-08 DIAGNOSIS — E161 Other hypoglycemia: Secondary | ICD-10-CM | POA: Diagnosis not present

## 2023-12-10 DIAGNOSIS — B9681 Helicobacter pylori [H. pylori] as the cause of diseases classified elsewhere: Secondary | ICD-10-CM | POA: Diagnosis not present

## 2023-12-16 DIAGNOSIS — L719 Rosacea, unspecified: Secondary | ICD-10-CM | POA: Diagnosis not present

## 2023-12-16 DIAGNOSIS — L820 Inflamed seborrheic keratosis: Secondary | ICD-10-CM | POA: Diagnosis not present

## 2023-12-22 DIAGNOSIS — E7800 Pure hypercholesterolemia, unspecified: Secondary | ICD-10-CM | POA: Diagnosis not present

## 2023-12-22 DIAGNOSIS — R7989 Other specified abnormal findings of blood chemistry: Secondary | ICD-10-CM | POA: Diagnosis not present

## 2023-12-22 DIAGNOSIS — B379 Candidiasis, unspecified: Secondary | ICD-10-CM | POA: Diagnosis not present

## 2023-12-22 DIAGNOSIS — E039 Hypothyroidism, unspecified: Secondary | ICD-10-CM | POA: Diagnosis not present

## 2024-01-03 DIAGNOSIS — R051 Acute cough: Secondary | ICD-10-CM | POA: Diagnosis not present

## 2024-01-03 DIAGNOSIS — H6693 Otitis media, unspecified, bilateral: Secondary | ICD-10-CM | POA: Diagnosis not present

## 2024-01-03 DIAGNOSIS — J019 Acute sinusitis, unspecified: Secondary | ICD-10-CM | POA: Diagnosis not present

## 2024-01-08 DIAGNOSIS — B029 Zoster without complications: Secondary | ICD-10-CM | POA: Diagnosis not present

## 2024-01-12 DIAGNOSIS — Z1331 Encounter for screening for depression: Secondary | ICD-10-CM | POA: Diagnosis not present

## 2024-01-12 DIAGNOSIS — Z01419 Encounter for gynecological examination (general) (routine) without abnormal findings: Secondary | ICD-10-CM | POA: Diagnosis not present

## 2024-05-15 DIAGNOSIS — L659 Nonscarring hair loss, unspecified: Secondary | ICD-10-CM | POA: Diagnosis not present

## 2024-05-15 DIAGNOSIS — Z79899 Other long term (current) drug therapy: Secondary | ICD-10-CM | POA: Diagnosis not present

## 2024-06-08 DIAGNOSIS — L659 Nonscarring hair loss, unspecified: Secondary | ICD-10-CM | POA: Diagnosis not present

## 2024-06-08 DIAGNOSIS — E039 Hypothyroidism, unspecified: Secondary | ICD-10-CM | POA: Diagnosis not present

## 2024-06-08 DIAGNOSIS — B379 Candidiasis, unspecified: Secondary | ICD-10-CM | POA: Diagnosis not present

## 2024-09-20 DIAGNOSIS — E039 Hypothyroidism, unspecified: Secondary | ICD-10-CM | POA: Diagnosis not present

## 2024-10-06 ENCOUNTER — Other Ambulatory Visit: Payer: Self-pay | Admitting: Obstetrics & Gynecology

## 2024-10-06 DIAGNOSIS — Z1231 Encounter for screening mammogram for malignant neoplasm of breast: Secondary | ICD-10-CM

## 2024-10-24 DIAGNOSIS — L659 Nonscarring hair loss, unspecified: Secondary | ICD-10-CM | POA: Diagnosis not present

## 2024-10-24 DIAGNOSIS — B379 Candidiasis, unspecified: Secondary | ICD-10-CM | POA: Diagnosis not present

## 2024-10-24 DIAGNOSIS — E039 Hypothyroidism, unspecified: Secondary | ICD-10-CM | POA: Diagnosis not present

## 2024-10-24 DIAGNOSIS — Z79890 Hormone replacement therapy (postmenopausal): Secondary | ICD-10-CM | POA: Diagnosis not present

## 2024-11-03 ENCOUNTER — Inpatient Hospital Stay
Admission: RE | Admit: 2024-11-03 | Discharge: 2024-11-03 | Attending: Obstetrics & Gynecology | Admitting: Obstetrics & Gynecology

## 2024-11-03 DIAGNOSIS — Z1231 Encounter for screening mammogram for malignant neoplasm of breast: Secondary | ICD-10-CM
